# Patient Record
Sex: Female | Born: 2000 | Race: Black or African American | Hispanic: No | Marital: Single | Smoking: Former smoker
Health system: Southern US, Community
[De-identification: ages and names within clinical notes are randomized; demographics above are authoritative.]

## PROBLEM LIST (undated history)

## (undated) ENCOUNTER — Inpatient Hospital Stay (HOSPITAL_COMMUNITY): Payer: Self-pay

## (undated) DIAGNOSIS — Z789 Other specified health status: Secondary | ICD-10-CM

## (undated) DIAGNOSIS — D573 Sickle-cell trait: Secondary | ICD-10-CM

## (undated) HISTORY — PX: OTHER SURGICAL HISTORY: SHX169

## (undated) HISTORY — PX: NO PAST SURGERIES: SHX2092

---
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
## 2019-06-11 ENCOUNTER — Ambulatory Visit (HOSPITAL_COMMUNITY)
Admission: EM | Admit: 2019-06-11 | Discharge: 2019-06-11 | Disposition: A | Discharge status: Home / Self Care | Payer: Self-pay | Attending: Family Medicine | Admitting: Family Medicine

## 2019-06-11 ENCOUNTER — Encounter (HOSPITAL_COMMUNITY): Payer: Self-pay

## 2019-06-11 ENCOUNTER — Other Ambulatory Visit: Payer: Self-pay

## 2019-06-11 DIAGNOSIS — N939 Abnormal uterine and vaginal bleeding, unspecified: Secondary | ICD-10-CM | POA: Insufficient documentation

## 2019-06-11 DIAGNOSIS — Z113 Encounter for screening for infections with a predominantly sexual mode of transmission: Secondary | ICD-10-CM | POA: Insufficient documentation

## 2019-06-11 DIAGNOSIS — Z3202 Encounter for pregnancy test, result negative: Secondary | ICD-10-CM | POA: Insufficient documentation

## 2019-06-11 LAB — POCT URINALYSIS DIP (DEVICE)
Bilirubin Urine: NEGATIVE
Glucose, UA: NEGATIVE mg/dL
Ketones, ur: NEGATIVE mg/dL
Leukocytes,Ua: NEGATIVE
Nitrite: NEGATIVE
Protein, ur: NEGATIVE mg/dL
Specific Gravity, Urine: 1.025 (ref 1.005–1.030)
Urobilinogen, UA: 0.2 mg/dL (ref 0.0–1.0)
pH: 6.5 (ref 5.0–8.0)

## 2019-06-11 LAB — POCT PREGNANCY, URINE: Preg Test, Ur: NEGATIVE

## 2019-06-11 NOTE — ED Triage Notes (Signed)
Pt presents with possible pregnancy and wanting to get STD testing; pt states she has had some unusual discharge and an abnormal period.

## 2019-06-11 NOTE — ED Provider Notes (Signed)
[facility]    CSN: [id number] Arrival date & time: 06/11/19  0857      History   Chief Complaint Chief Complaint  Patient presents with  . Possible Pregnancy  . STD Testing    HPI Alicia Black is a 18 y.o. female.   Alicia Barbankidah Darwin presents with complaints of vaginal spotting for the past two days. Only noted with wiping. LMP 6/28. She is sexually active. No specific known std exposure but she doesn't use condoms. No pelvic or back pain. No urinary symptoms. No fever. No gi symptoms. Bleeding hasn't worsened. States it is pink blood with wiping after urination. Denies any other vaginal discharge or itching. Has had gonorrhea in the past. BV last a few months ago. States her periods are typically regular. Doesn't have a PCP. She does smoke.     ROS per HPI, negative if not otherwise mentioned.      History reviewed. No pertinent past medical history.  There are no active problems to display for this patient.   History reviewed. No pertinent surgical history.  OB History   No obstetric history on file.      Home Medications    Prior to Admission medications   Not on File    Family History Family History  Family history unknown: Yes    Social History Social History   Tobacco Use  . Smoking status: Never Smoker  . Smokeless tobacco: Never Used  Substance Use Topics  . Alcohol use: Never    Frequency: Never  . Drug use: Yes    Types: Marijuana     Allergies   Patient has no known allergies.   Review of Systems Review of Systems   Physical Exam Triage Vital Signs ED Triage Vitals [06/11/19 0923]  Enc Vitals Group     BP 111/76     Pulse Rate 66     Resp 18     Temp 98.5 F (36.9 C)     Temp Source Oral     SpO2 100 %     Weight      Height      Head Circumference      Peak Flow      Pain Score 2     Pain Loc      Pain Edu?      Excl. in GC?    No data found.  Updated Vital Signs BP 111/76 (BP Location: Left Arm)    Pulse 66   Temp 98.5 F (36.9 C) (Oral)   Resp 18   LMP 05/30/2019   SpO2 100%   Visual Acuity Right Eye Distance:   Left Eye Distance:   Bilateral Distance:    Right Eye Near:   Left Eye Near:    Bilateral Near:     Physical Exam Constitutional:      General: She is not in acute distress.    Appearance: She is well-developed.  Cardiovascular:     Rate and Rhythm: Normal rate and regular rhythm.     Heart sounds: Normal heart sounds.  Pulmonary:     Effort: Pulmonary effort is normal.     Breath sounds: Normal breath sounds.  Abdominal:     Palpations: Abdomen is soft. Abdomen is not rigid.     Tenderness: There is no abdominal tenderness. There is no guarding or rebound.  Genitourinary:    Comments: Denies sores, lesions; no pelvic pain; gu exam deferred at this time, vaginal self swab collected.  Skin:    General: Skin is warm and dry.  Neurological:     Mental Status: She is alert and oriented to person, place, and time.      UC Treatments / Results  Labs (all labs ordered are listed, but only abnormal results are displayed) Labs Reviewed  POCT URINALYSIS DIP (DEVICE) - Abnormal; Notable for the following components:      Result Value   Hgb urine dipstick MODERATE (*)    All other components within normal limits  POC URINE PREG, ED  POCT PREGNANCY, URINE  CERVICOVAGINAL ANCILLARY ONLY    EKG   Radiology No results found.  Procedures Procedures (including critical care time)  Medications Ordered in UC Medications - No data to display  Initial Impression / Assessment and Plan / UC Course  I have reviewed the triage vital signs and the nursing notes.  Pertinent labs & imaging results that were available during my care of the patient were reviewed by me and considered in my medical decision making (see chart for details).     Negative urine pregnancy and urine dip without red flags. Vaginal spotting, no fever, no pelvic or back pain. Vaginal  cytology self swab collected and pending. Will notify of any positive findings and if any changes to treatment are needed.  Encouraged self sex practices. Return precautions provided. Patient verbalized understanding and agreeable to plan.   Final Clinical Impressions(s) / UC Diagnoses   Final diagnoses:  Vaginal spotting  Screen for STD (sexually transmitted disease)  Negative pregnancy test     Discharge Instructions     Negative pregnancy here today.  If you miss your period please check a home pregnancy test.  We will test you for vaginal infections as these can cause some vaginal spotting. Will notify you of any positive findings and if any changes to treatment are needed. You may monitor your results on your MyChart online as well.   Please use condoms to prevent std's.  If you are not looking to be pregnant, I would recommend establishing with a primary care provider to initiate birth control.    ED Prescriptions    None     Controlled Substance Prescriptions [place] Controlled Substance Registry consulted? Not Applicable   Zigmund Gottron, NP 06/11/19 [phone]

## 2019-06-11 NOTE — Discharge Instructions (Signed)
Negative pregnancy here today.  If you miss your period please check a home pregnancy test.  We will test you for vaginal infections as these can cause some vaginal spotting. Will notify you of any positive findings and if any changes to treatment are needed. You may monitor your results on your MyChart online as well.   Please use condoms to prevent std's.  If you are not looking to be pregnant, I would recommend establishing with a primary care provider to initiate birth control.

## 2019-06-14 LAB — CERVICOVAGINAL ANCILLARY ONLY
Bacterial vaginitis: POSITIVE — AB
Candida vaginitis: NEGATIVE
Chlamydia: NEGATIVE
Neisseria Gonorrhea: NEGATIVE
Trichomonas: NEGATIVE

## 2019-06-16 ENCOUNTER — Telehealth (HOSPITAL_COMMUNITY): Payer: Self-pay | Admitting: Emergency Medicine

## 2019-06-16 MED ORDER — METRONIDAZOLE 500 MG PO TABS: 500.0000 mg | ORAL_TABLET | Freq: Two times a day (BID) | ORAL | 0 refills | Status: DC

## 2019-06-16 NOTE — Telephone Encounter (Signed)
Bacterial vaginosis is positive. This was not treated at the urgent care visit.  Flagyl 500 mg BID x 7 days #14 no refills sent to patients pharmacy of choice.    Patient contacted and made aware of all results, all questions answered.  

## 2019-10-13 ENCOUNTER — Other Ambulatory Visit: Payer: Self-pay

## 2019-10-13 ENCOUNTER — Inpatient Hospital Stay (HOSPITAL_COMMUNITY): Payer: Medicaid Other

## 2019-10-13 ENCOUNTER — Encounter (HOSPITAL_COMMUNITY): Payer: Self-pay | Admitting: *Deleted

## 2019-10-13 ENCOUNTER — Inpatient Hospital Stay (HOSPITAL_COMMUNITY)
Admission: AD | Admit: 2019-10-13 | Discharge: 2019-10-13 | Disposition: A | Discharge status: Home / Self Care | Payer: Medicaid Other | Attending: Obstetrics & Gynecology | Admitting: Obstetrics & Gynecology

## 2019-10-13 DIAGNOSIS — O209 Hemorrhage in early pregnancy, unspecified: Secondary | ICD-10-CM | POA: Insufficient documentation

## 2019-10-13 DIAGNOSIS — Z3A01 Less than 8 weeks gestation of pregnancy: Secondary | ICD-10-CM | POA: Diagnosis not present

## 2019-10-13 DIAGNOSIS — O3680X0 Pregnancy with inconclusive fetal viability, not applicable or unspecified: Secondary | ICD-10-CM

## 2019-10-13 LAB — URINALYSIS, ROUTINE W REFLEX MICROSCOPIC
Bilirubin Urine: NEGATIVE
Glucose, UA: NEGATIVE mg/dL
Ketones, ur: NEGATIVE mg/dL
Leukocytes,Ua: NEGATIVE
Nitrite: NEGATIVE
Protein, ur: NEGATIVE mg/dL
Specific Gravity, Urine: 1.012 (ref 1.005–1.030)
pH: 6 (ref 5.0–8.0)

## 2019-10-13 LAB — CBC
HCT: 35.5 % — ABNORMAL LOW (ref 36.0–46.0)
Hemoglobin: 12.6 g/dL (ref 12.0–15.0)
MCH: 30.8 pg (ref 26.0–34.0)
MCHC: 35.5 g/dL (ref 30.0–36.0)
MCV: 86.8 fL (ref 80.0–100.0)
Platelets: 262 10*3/uL (ref 150–400)
RBC: 4.09 MIL/uL (ref 3.87–5.11)
RDW: 11.2 % — ABNORMAL LOW (ref 11.5–15.5)
WBC: 11 10*3/uL — ABNORMAL HIGH (ref 4.0–10.5)
nRBC: 0 % (ref 0.0–0.2)

## 2019-10-13 LAB — WET PREP, GENITAL
Sperm: NONE SEEN
Trich, Wet Prep: NONE SEEN
Yeast Wet Prep HPF POC: NONE SEEN

## 2019-10-13 LAB — HIV ANTIBODY (ROUTINE TESTING W REFLEX): HIV Screen 4th Generation wRfx: NONREACTIVE

## 2019-10-13 LAB — ABO/RH: ABO/RH(D): O POS

## 2019-10-13 LAB — POCT PREGNANCY, URINE: Preg Test, Ur: POSITIVE — AB

## 2019-10-13 LAB — HCG, QUANTITATIVE, PREGNANCY: hCG, Beta Chain, Quant, S: 7687 m[IU]/mL — ABNORMAL HIGH (ref <5)

## 2019-10-13 NOTE — MAU Note (Signed)
Pt reports to MAU c/o vaginal bleeding. Pt states she was taking a nap she noticed some red bleeding in her underwear since then it has gotten better. Pt states she is not currently bleeding but she was concerned. Pt denies any recent sexual intercourse. LMP was September 28th 2020. [facility].

## 2019-10-13 NOTE — MAU Provider Note (Signed)
Chief Complaint: Vaginal Bleeding   First Provider Initiated Contact with Patient 10/13/19 2040        SUBJECTIVE HPI: Alicia Black is a 18 y.o. G1P0 at [redacted]w[redacted]d by LMP who presents to maternity admissions reporting vaginal bleeding today.  No pain.  . She denies vaginal itching/burning, urinary symptoms, h/a, dizziness, n/v, or fever/chills.    Vaginal Bleeding The patient's primary symptoms include vaginal bleeding. The patient's pertinent negatives include no genital itching, genital lesions, genital odor or pelvic pain. This is a new problem. The current episode started today. The problem occurs intermittently. The problem has been unchanged. The patient is experiencing no pain. She is pregnant. Pertinent negatives include no back pain, chills, constipation, diarrhea, fever, nausea or vomiting. The vaginal discharge was bloody. The vaginal bleeding is spotting. She has not been passing clots. She has not been passing tissue. Nothing aggravates the symptoms. She has tried nothing for the symptoms. She uses nothing for contraception.   RN Note: Pt reports to MAU c/o vaginal bleeding. Pt states she was taking a nap she noticed some red bleeding in her underwear since then it has gotten better. Pt states she is not currently bleeding but she was concerned. Pt denies any recent sexual intercourse. LMP was September 28th 2020. Pt reports a [facility]UPT at the teen clinic.   No past medical history on file. No past surgical history on file. Social History   Socioeconomic History  . Marital status: Single    Spouse name: Not on file  . Number of children: Not on file  . Years of education: Not on file  . Highest education level: Not on file  Occupational History  . Not on file  Social Needs  . Financial resource strain: Not on file  . Food insecurity    Worry: Not on file    Inability: Not on file  . Transportation needs    Medical: Not on file    Non-medical: Not on file  Tobacco Use  . Smoking  status: Never Smoker  . Smokeless tobacco: Never Used  Substance and Sexual Activity  . Alcohol use: Never    Frequency: Never  . Drug use: Yes    Types: Marijuana  . Sexual activity: Not on file  Lifestyle  . Physical activity    Days per week: Not on file    Minutes per session: Not on file  . Stress: Not on file  Relationships  . Social Herbalist on phone: Not on file    Gets together: Not on file    Attends religious service: Not on file    Active member of club or organization: Not on file    Attends meetings of clubs or organizations: Not on file    Relationship status: Not on file  . Intimate partner violence    Fear of current or ex partner: Not on file    Emotionally abused: Not on file    Physically abused: Not on file    Forced sexual activity: Not on file  Other Topics Concern  . Not on file  Social History Narrative  . Not on file   No current facility-administered medications on file prior to encounter.    Current Outpatient Medications on File Prior to Encounter  Medication Sig Dispense Refill  . Prenatal Vit-Fe Fumarate-FA (PRENATAL MULTIVITAMIN) TABS tablet Take 1 tablet by mouth daily at 12 noon.     No Known Allergies  I have reviewed patient's Past Medical  Hx, Surgical Hx, Family Hx, Social Hx, medications and allergies.   ROS:  Review of Systems  Constitutional: Negative for chills and fever.  Gastrointestinal: Negative for constipation, diarrhea, nausea and vomiting.  Genitourinary: Positive for vaginal bleeding. Negative for pelvic pain.  Musculoskeletal: Negative for back pain.   Review of Systems  Other systems negative   Physical Exam  Physical Exam Patient Vitals for the past 24 hrs:  BP Temp Temp src Pulse Resp Weight  10/13/19 1939 129/74 98.8 F (37.1 C) Oral 69 17 63.4 kg   Constitutional: Well-developed, well-nourished female in no acute distress.  Cardiovascular: normal rate Respiratory: normal effort GI: Abd  soft, non-tender. Pos BS x 4 MS: Extremities nontender, no edema, normal ROM Neurologic: Alert and oriented x 4.  GU: Neg CVAT.  PELVIC EXAM: Cervix pink, visually closed, without lesion, scant red discharge, vaginal walls and external genitalia normal Bimanual exam: Cervix 0/long/high, firm, anterior, neg CMT, uterus nontender, not very enlarged, adnexa without tenderness, enlargement, or mass   LAB RESULTS Results for orders placed or performed during the hospital encounter of 10/13/19 (from the past 24 hour(s))  Urinalysis, Routine w reflex microscopic     Status: Abnormal   Collection Time: 10/13/19  7:10 PM  Result Value Ref Range   Color, Urine YELLOW YELLOW   APPearance CLEAR CLEAR   Specific Gravity, Urine 1.012 1.005 - 1.030   pH 6.0 5.0 - 8.0   Glucose, UA NEGATIVE NEGATIVE mg/dL   Hgb urine dipstick LARGE (A) NEGATIVE   Bilirubin Urine NEGATIVE NEGATIVE   Ketones, ur NEGATIVE NEGATIVE mg/dL   Protein, ur NEGATIVE NEGATIVE mg/dL   Nitrite NEGATIVE NEGATIVE   Leukocytes,Ua NEGATIVE NEGATIVE   RBC / HPF 11-20 0 - 5 RBC/hpf   WBC, UA 0-5 0 - 5 WBC/hpf   Bacteria, UA RARE (A) NONE SEEN   Squamous Epithelial / LPF 0-5 0 - 5  Pregnancy, urine POC     Status: Abnormal   Collection Time: 10/13/19  7:18 PM  Result Value Ref Range   Preg Test, Ur POSITIVE (A) NEGATIVE  CBC     Status: Abnormal   Collection Time: 10/13/19  8:25 PM  Result Value Ref Range   WBC 11.0 (H) 4.0 - 10.5 K/uL   RBC 4.09 3.87 - 5.11 MIL/uL   Hemoglobin 12.6 12.0 - 15.0 g/dL   HCT 44.0 (L) 34.7 - 42.5 %   MCV 86.8 80.0 - 100.0 fL   MCH 30.8 26.0 - 34.0 pg   MCHC 35.5 30.0 - 36.0 g/dL   RDW 95.6 (L) 38.7 - 56.4 %   Platelets 262 150 - 400 K/uL   nRBC 0.0 0.0 - 0.2 %  hCG quantitative     Status: Abnormal   Collection Time: 10/13/19  8:25 PM  Result Value Ref Range   hCG, Beta Chain, Quant, S 7,687 (H) <5 mIU/mL  HIV Antibody (routine testing w rflx)     Status: None   Collection Time: 10/13/19   8:25 PM  Result Value Ref Range   HIV Screen 4th Generation wRfx NON REACTIVE NON REACTIVE  ABO/Rh     Status: None   Collection Time: 10/13/19  8:25 PM  Result Value Ref Range   ABO/RH(D) O POS    No rh immune globuloin      NOT A RH IMMUNE GLOBULIN CANDIDATE, PT RH POSITIVE Performed at [facility], [address], [place], [place] [address]   Wet prep, genital  Status: Abnormal   Collection Time: 10/13/19  8:48 PM  Result Value Ref Range   Yeast Wet Prep HPF POC NONE SEEN NONE SEEN   Trich, Wet Prep NONE SEEN NONE SEEN   Clue Cells Wet Prep HPF POC PRESENT (A) NONE SEEN   WBC, Wet Prep HPF POC MODERATE (A) NONE SEEN   Sperm NONE SEEN     IMAGING [place] Ob Less Than 14 Weeks With Ob Transvaginal  Result Date: 10/13/2019 CLINICAL DATA:  Vaginal bleeding. EXAM: OBSTETRIC <14 WK US AND TRANSVAGINAL OB US TECHNIQUE: Both transabdominal and transvaginal ultrasound examinations were performed for complete evaluation of the gestation as well as the maternal uterus, adnexal regions, and pelvic cul-de-sac. Transvaginal technique was performed to assess early pregnancy. COMPARISON:  None. FINDINGS: Intrauterine gestational sac: None Yolk sac:  Not Visualized. Embryo:  Not Visualized. Cardiac Activity: Not Visualized. Subchorionic hemorrhage:  None visualized. Maternal uterus/adnexae: There is no maternal abnormality. The right ovary measures 3.5 x 3.5 x 3.1 cm. There is a small corpus luteal cyst. The left ovary measures 3.3 x 1.9 x 1.8 cm. There is a trace amount of free fluid in the patient's pelvis. IMPRESSION: No IUP is visualized. By definition, in the setting of a positive pregnancy test, this reflects a pregnancy of unknown location. Differential considerations include early normal IUP, abnormal IUP/missed abortion, or nonvisualized ectopic pregnancy. Serial beta HCG is suggested. Consider repeat pelvic ultrasound in 14 days. Electronically Signed   By: Katherine Mantlehristopher  Green M.D.   On:  10/13/2019 21:18     MAU Management/MDM: Ordered usual first trimester r/o ectopic labs.   Pelvic exam and cultures done Will check baseline Ultrasound to rule out ectopic.  This bleeding/pain can represent a normal pregnancy with bleeding, spontaneous abortion or even an ectopic which can be life-threatening.  The process as listed above helps to determine which of these is present.  Reviewed results Unable to see pregnancy on US, so with bleeding we cannot rule out ectopic, SAB or normal early pregnancy  Recommend repeat HCG in 48 hrs then US if doubles  ASSESSMENT Pregnancy at 4071w3d Pregnancy of unknown location Bleeding in first trimester  PLAN Discharge home Plan to repeat HCG level in 48 hours in clinic  Will repeat  Ultrasound in about 7-10 days if HCG levels double appropriately  Ectopic precautions   Pt stable at time of discharge. Encouraged to return here or to other Urgent Care/ED if she develops worsening of symptoms, increase in pain, fever, or other concerning symptoms.    Wynelle BourgeoisMarie Williams CNM, MSN Certified Nurse-Midwife 10/13/2019  8:40 PM

## 2019-10-13 NOTE — Discharge Instructions (Signed)
Vaginal Bleeding During Pregnancy, First Trimester ° °A small amount of bleeding from the vagina (spotting) is relatively common during early pregnancy. It usually stops on its own. Various things may cause bleeding or spotting during early pregnancy. Some bleeding may be related to the pregnancy, and some may not. In many cases, the bleeding is normal and is not a problem. However, bleeding can also be a sign of something serious. Be sure to tell your health care provider about any vaginal bleeding right away. °Some possible causes of vaginal bleeding during the first trimester include: °· Infection or inflammation of the cervix. °· Growths (polyps) on the cervix. °· Miscarriage or threatened miscarriage. °· Pregnancy tissue developing outside of the uterus (ectopic pregnancy). °· A mass of tissue developing in the uterus due to an egg being fertilized incorrectly (molar pregnancy). °Follow these instructions at home: °Activity °· Follow instructions from your health care provider about limiting your activity. Ask what activities are safe for you. °· If needed, make plans for someone to help with your regular activities. °· Do not have sex or orgasms until your health care provider says that this is safe. °General instructions °· Take over-the-counter and prescription medicines only as told by your health care provider. °· Pay attention to any changes in your symptoms. °· Do not use tampons or douche. °· Write down how many pads you use each day, how often you change pads, and how soaked (saturated) they are. °· If you pass any tissue from your vagina, save the tissue so you can show it to your health care provider. °· Keep all follow-up visits as told by your health care provider. This is important. °Contact a health care provider if: °· You have vaginal bleeding during any part of your pregnancy. °· You have cramps or labor pains. °· You have a fever. °Get help right away if: °· You have severe cramps in your  back or abdomen. °· You pass large clots or a large amount of tissue from your vagina. °· Your bleeding increases. °· You feel light-headed or weak, or you faint. °· You have chills. °· You are leaking fluid or have a gush of fluid from your vagina. °Summary °· A small amount of bleeding (spotting) from the vagina is relatively common during early pregnancy. °· Various things may cause bleeding or spotting in early pregnancy. °· Be sure to tell your health care provider about any vaginal bleeding right away. °This information is not intended to replace advice given to you by your health care provider. Make sure you discuss any questions you have with your health care provider. °Document Released: 08/28/2005 Document Revised: 03/09/2019 Document Reviewed: 02/20/2017 °Elsevier Patient Education © 2020 [employer] ° °

## 2019-10-15 LAB — GC/CHLAMYDIA PROBE AMP (~~LOC~~) NOT AT ARMC
Chlamydia: NEGATIVE
Comment: NEGATIVE
Comment: NORMAL
Neisseria Gonorrhea: NEGATIVE

## 2019-10-16 ENCOUNTER — Other Ambulatory Visit: Payer: Self-pay

## 2019-10-16 ENCOUNTER — Inpatient Hospital Stay (HOSPITAL_COMMUNITY)
Admission: AD | Admit: 2019-10-16 | Discharge: 2019-10-16 | Disposition: A | Discharge status: Home / Self Care | Payer: Medicaid Other | Attending: Obstetrics and Gynecology | Admitting: Obstetrics and Gynecology

## 2019-10-16 DIAGNOSIS — O039 Complete or unspecified spontaneous abortion without complication: Secondary | ICD-10-CM | POA: Insufficient documentation

## 2019-10-16 LAB — HCG, QUANTITATIVE, PREGNANCY: hCG, Beta Chain, Quant, S: 1350 m[IU]/mL — ABNORMAL HIGH (ref <5)

## 2019-10-16 NOTE — MAU Provider Note (Signed)
Chief Complaint: Follow-up   None     SUBJECTIVE HPI: Alicia Black is a 18 y.o. G1P0 at [redacted]w[redacted]d by LMP who presents to maternity admissions for repeat hcg today. She initially presented on 10/13/19 with light vaginal bleeding in pregnancy.  Hcg on 10/13/19 was 7, 687 and [place] did not confirm IUP or ectopic.  She denies any pain or bleeding today.       HPI  Past Medical History:  Diagnosis Date  . Medical history non-contributory    Past Surgical History:  Procedure Laterality Date  . NO PAST SURGERIES     Social History   Socioeconomic History  . Marital status: Single    Spouse name: Not on file  . Number of children: Not on file  . Years of education: Not on file  . Highest education level: Not on file  Occupational History  . Not on file  Social Needs  . Financial resource strain: Not on file  . Food insecurity    Worry: Not on file    Inability: Not on file  . Transportation needs    Medical: Not on file    Non-medical: Not on file  Tobacco Use  . Smoking status: Never Smoker  . Smokeless tobacco: Never Used  Substance and Sexual Activity  . Alcohol use: Never    Frequency: Never  . Drug use: Not Currently    Types: Marijuana  . Sexual activity: Yes  Lifestyle  . Physical activity    Days per week: Not on file    Minutes per session: Not on file  . Stress: Not on file  Relationships  . Social Musician on phone: Not on file    Gets together: Not on file    Attends religious service: Not on file    Active member of club or organization: Not on file    Attends meetings of clubs or organizations: Not on file    Relationship status: Not on file  . Intimate partner violence    Fear of current or ex partner: Not on file    Emotionally abused: Not on file    Physically abused: Not on file    Forced sexual activity: Not on file  Other Topics Concern  . Not on file  Social History Narrative  . Not on file   No current facility-administered  medications on file prior to encounter.    Current Outpatient Medications on File Prior to Encounter  Medication Sig Dispense Refill  . Prenatal Vit-Fe Fumarate-FA (PRENATAL MULTIVITAMIN) TABS tablet Take 1 tablet by mouth daily at 12 noon.     No Known Allergies  ROS:  Review of Systems  Constitutional: Negative for chills, fatigue and fever.  Eyes: Negative for visual disturbance.  Respiratory: Negative for shortness of breath.   Cardiovascular: Negative for chest pain.  Gastrointestinal: Negative for abdominal pain, nausea and vomiting.  Genitourinary: Negative for difficulty urinating, dysuria, flank pain, pelvic pain, vaginal bleeding, vaginal discharge and vaginal pain.  Neurological: Negative for dizziness and headaches.  Psychiatric/Behavioral: Negative.      I have reviewed patient's Past Medical Hx, Surgical Hx, Family Hx, Social Hx, medications and allergies.   Physical Exam   Patient Vitals for the past 24 hrs:  BP Temp Temp src Pulse Resp SpO2 Height Weight  10/16/19 1106 127/68 98.5 F (36.9 C) Oral 87 16 100 % - -  10/16/19 1105 - - - - - - 5\' 8"  (1.727 m) 62.5 kg  Constitutional: Well-developed, well-nourished female in no acute distress.  Cardiovascular: normal rate Respiratory: normal effort GI: Abd soft, non-tender. Pos BS x 4 MS: Extremities nontender, no edema, normal ROM Neurologic: Alert and oriented x 4.  GU: Neg CVAT.   LAB RESULTS Results for orders placed or performed during the hospital encounter of 10/16/19 (from the past 24 hour(s))  hCG quantitative     Status: Abnormal   Collection Time: 10/16/19 11:05 AM  Result Value Ref Range   hCG, Beta Chain, Quant, S 1,350 (H) <5 mIU/mL    --/--/O POS (11/11 2025)  IMAGING US Ob Less Than 14 Weeks With Ob Transvaginal  Result Date: 10/13/2019 CLINICAL DATA:  Vaginal bleeding. EXAM: OBSTETRIC <14 WK [place] AND TRANSVAGINAL OB US TECHNIQUE: Both transabdominal and transvaginal ultrasound  examinations were performed for complete evaluation of the gestation as well as the maternal uterus, adnexal regions, and pelvic cul-de-sac. Transvaginal technique was performed to assess early pregnancy. COMPARISON:  None. FINDINGS: Intrauterine gestational sac: None Yolk sac:  Not Visualized. Embryo:  Not Visualized. Cardiac Activity: Not Visualized. Subchorionic hemorrhage:  None visualized. Maternal uterus/adnexae: There is no maternal abnormality. The right ovary measures 3.5 x 3.5 x 3.1 cm. There is a small corpus luteal cyst. The left ovary measures 3.3 x 1.9 x 1.8 cm. There is a trace amount of free fluid in the patient's pelvis. IMPRESSION: No IUP is visualized. By definition, in the setting of a positive pregnancy test, this reflects a pregnancy of unknown location. Differential considerations include early normal IUP, abnormal IUP/missed abortion, or nonvisualized ectopic pregnancy. Serial beta HCG is suggested. Consider repeat pelvic ultrasound in 14 days. Electronically Signed   By: Constance Holster M.D.   On: 10/13/2019 21:18    MAU Management/MDM: Orders Placed This Encounter  Procedures  . hCG quantitative  . Discharge patient    No orders of the defined types were placed in this encounter.   Quant hcg today with significant drop, indicating SAB.  Discussed results with pt.  Pt states understanding.  Plan to f/u with hcg in 1 week visit with provider in 2 weeks at [facility], message sent to establish care.  Bleeding/miscarriage precautions/reasons to return to MAU reviewed.    ASSESSMENT 1. SAB (spontaneous abortion)     PLAN Discharge home Allergies as of 10/16/2019   No Known Allergies     Medication List    TAKE these medications   prenatal multivitamin Tabs tablet Take 1 tablet by mouth daily at 12 noon.        Alicia Black Certified Nurse-Midwife 10/16/2019  12:44 PM

## 2019-10-16 NOTE — MAU Note (Signed)
Alicia Black is a 18 y.o. at [redacted]w[redacted]d here in MAU reporting: here for follow up hcg. Denies pain or bleeding.   Pain score: 0/10  Vitals:   10/16/19 1106  BP: 127/68  Pulse: 87  Resp: 16  Temp: 98.5 F (36.9 C)  SpO2: 100%     Lab orders placed from triage: hcg

## 2019-10-21 ENCOUNTER — Other Ambulatory Visit: Payer: Self-pay | Admitting: *Deleted

## 2019-10-21 DIAGNOSIS — O039 Complete or unspecified spontaneous abortion without complication: Secondary | ICD-10-CM

## 2019-10-25 ENCOUNTER — Other Ambulatory Visit: Payer: Self-pay

## 2019-11-03 ENCOUNTER — Encounter: Payer: Self-pay | Admitting: Advanced Practice Midwife

## 2019-12-03 NOTE — L&D Delivery Note (Addendum)
..  OB/GYN Faculty Practice Delivery Note  Alicia Black is a 19 y.o. G2P0010 s/p vaginal delivery at [redacted]w[redacted]d. She was admitted for PPROM   ROM: 11:30 8.5.21 with  fluid GBS Status: NEGATIVE  Maximum Maternal Temperature: 98.8  Labor Progress: [name] admitted on 8.5.21 after leaking fluid in a puddle on the floor.  Upon admission she was contracting every 5 minutes and her initial cervical exam was 1/70/-1.  She received Betamethasone at 1400 as well as GBS prophylaxis for unknown GBS status.  She began labor augmentation at 1650 with pitocin.  A foley bulb was inserted and came out on its own shortly after.  She continued to progress and received an epidural and amniofusion for some variable decelerations.  She was complete at 00:04  Delivery Date/Time: 8.6.21 at 00:25  Delivery: Called to room and patient was complete and pushing. Head delivered LOA. No nuchal cord present. Shoulder and body delivered in usual fashion. Infant with spontaneous cry, placed on mother's abdomen, dried and stimulated. Cord clamped x 2 after 1-minute delay, and cut by friend of Mother. Cord blood drawn. Placenta delivered spontaneously with gentle cord traction. Fundus firm with massage and Pitocin. Labia, perineum, vagina, and cervix inspected inspected. Bilateral labial lacerations repaired with one interrupted stitch each.   Placenta: 3 V cord, intact Complications: IUGR  Lacerations: 2 bilateral upper labial lacerations EBL: 150 mL Analgesia: epidural   Postpartum Planning [ x] message to sent to schedule follow-up  [ x] vaccines UTD  Infant: APGARs 8 & 9   Weight not assessed yet   Bryson Dames, SNM  07/07/2020, 1:03 AM  The above was performed under my direct supervision and guidance.

## 2019-12-08 ENCOUNTER — Other Ambulatory Visit: Payer: Self-pay

## 2019-12-08 ENCOUNTER — Encounter (HOSPITAL_COMMUNITY): Payer: Self-pay

## 2019-12-08 DIAGNOSIS — O4691 Antepartum hemorrhage, unspecified, first trimester: Secondary | ICD-10-CM | POA: Insufficient documentation

## 2019-12-08 DIAGNOSIS — Z3A01 Less than 8 weeks gestation of pregnancy: Secondary | ICD-10-CM | POA: Insufficient documentation

## 2019-12-08 DIAGNOSIS — B9689 Other specified bacterial agents as the cause of diseases classified elsewhere: Secondary | ICD-10-CM | POA: Insufficient documentation

## 2019-12-08 DIAGNOSIS — Z79899 Other long term (current) drug therapy: Secondary | ICD-10-CM | POA: Diagnosis not present

## 2019-12-08 DIAGNOSIS — N760 Acute vaginitis: Secondary | ICD-10-CM | POA: Insufficient documentation

## 2019-12-08 DIAGNOSIS — O99891 Other specified diseases and conditions complicating pregnancy: Secondary | ICD-10-CM | POA: Diagnosis not present

## 2019-12-08 NOTE — ED Notes (Signed)
Pt ambulatory to the bathroom in triage and is attempting to give urine specimen in triage.

## 2019-12-08 NOTE — ED Triage Notes (Signed)
Pt coming in c/o vaginal bleeding that started 30 minutes with clots. Pt is [redacted] weeks pregnant. Last miscarriage was Nov 14th (7 weeks at the time). No N/V/D

## 2019-12-09 ENCOUNTER — Emergency Department (HOSPITAL_COMMUNITY): Payer: Medicaid Other

## 2019-12-09 ENCOUNTER — Emergency Department (HOSPITAL_COMMUNITY)
Admission: EM | Admit: 2019-12-09 | Discharge: 2019-12-09 | Disposition: A | Discharge status: Home / Self Care | Payer: Medicaid Other | Attending: Emergency Medicine | Admitting: Emergency Medicine

## 2019-12-09 DIAGNOSIS — O4690 Antepartum hemorrhage, unspecified, unspecified trimester: Secondary | ICD-10-CM

## 2019-12-09 DIAGNOSIS — N939 Abnormal uterine and vaginal bleeding, unspecified: Secondary | ICD-10-CM

## 2019-12-09 LAB — URINALYSIS, ROUTINE W REFLEX MICROSCOPIC
Bilirubin Urine: NEGATIVE
Glucose, UA: NEGATIVE mg/dL
Ketones, ur: 20 mg/dL — AB
Leukocytes,Ua: NEGATIVE
Nitrite: NEGATIVE
Protein, ur: NEGATIVE mg/dL
Specific Gravity, Urine: 1.017 (ref 1.005–1.030)
pH: 6 (ref 5.0–8.0)

## 2019-12-09 LAB — CBC WITH DIFFERENTIAL/PLATELET
Abs Immature Granulocytes: 0.03 10*3/uL (ref 0.00–0.07)
Basophils Absolute: 0 10*3/uL (ref 0.0–0.1)
Basophils Relative: 0 %
Eosinophils Absolute: 0.1 10*3/uL (ref 0.0–0.5)
Eosinophils Relative: 1 %
HCT: 34.2 % — ABNORMAL LOW (ref 36.0–46.0)
Hemoglobin: 11.6 g/dL — ABNORMAL LOW (ref 12.0–15.0)
Immature Granulocytes: 0 %
Lymphocytes Relative: 24 %
Lymphs Abs: 2.5 10*3/uL (ref 0.7–4.0)
MCH: 30.4 pg (ref 26.0–34.0)
MCHC: 33.9 g/dL (ref 30.0–36.0)
MCV: 89.5 fL (ref 80.0–100.0)
Monocytes Absolute: 0.6 10*3/uL (ref 0.1–1.0)
Monocytes Relative: 6 %
Neutro Abs: 7.3 10*3/uL (ref 1.7–7.7)
Neutrophils Relative %: 69 %
Platelets: 283 10*3/uL (ref 150–400)
RBC: 3.82 MIL/uL — ABNORMAL LOW (ref 3.87–5.11)
RDW: 11.5 % (ref 11.5–15.5)
WBC: 10.6 10*3/uL — ABNORMAL HIGH (ref 4.0–10.5)
nRBC: 0 % (ref 0.0–0.2)

## 2019-12-09 LAB — BASIC METABOLIC PANEL
Anion gap: 9 (ref 5–15)
BUN: 6 mg/dL (ref 6–20)
CO2: 21 mmol/L — ABNORMAL LOW (ref 22–32)
Calcium: 9.1 mg/dL (ref 8.9–10.3)
Chloride: 105 mmol/L (ref 98–111)
Creatinine, Ser: 0.53 mg/dL (ref 0.44–1.00)
GFR calc Af Amer: >60 mL/min (ref >60)
GFR calc non Af Amer: >60 mL/min (ref >60)
Glucose, Bld: 82 mg/dL (ref 70–99)
Potassium: 3.4 mmol/L — ABNORMAL LOW (ref 3.5–5.1)
Sodium: 135 mmol/L (ref 135–145)

## 2019-12-09 LAB — PREGNANCY, URINE: Preg Test, Ur: POSITIVE — AB

## 2019-12-09 LAB — WET PREP, GENITAL
Sperm: NONE SEEN
Trich, Wet Prep: NONE SEEN
Yeast Wet Prep HPF POC: NONE SEEN

## 2019-12-09 LAB — HCG, QUANTITATIVE, PREGNANCY: hCG, Beta Chain, Quant, S: 14988 m[IU]/mL — ABNORMAL HIGH (ref <5)

## 2019-12-09 NOTE — ED Notes (Signed)
US at bedside

## 2019-12-09 NOTE — ED Provider Notes (Signed)
[facility]-EMERGENCY DEPT Provider Note   CSN: [id number] Arrival date & time: 12/08/19  2207     History Chief Complaint  Patient presents with  . Vaginal Bleeding    6 weeks preg    Alicia Black is a 19 y.o. female.  Patient reporting being [redacted] weeks pregnant presents with vaginal bleeding this evening. She reports very mild discomfort in lower abdomen. She passed a large clot and reports the bleeding progressively slowed, stopping after arrival in the ED. She reports history of recent miscarriage in November of 2020. She has not had an ultrasound or prenatal care yet. No nausea, vomiting or urinary symptoms.   The history is provided by the patient. No language interpreter was used.  Vaginal Bleeding Associated symptoms: no abdominal pain, no back pain, no dysuria, no fatigue and no vaginal discharge        Past Medical History:  Diagnosis Date  . Medical history non-contributory     There are no problems to display for this patient.   Past Surgical History:  Procedure Laterality Date  . NO PAST SURGERIES       OB History    Gravida  1   Para      Term      Preterm      AB      Living        SAB      TAB      Ectopic      Multiple      Live Births              Family History  Family history unknown: Yes    Social History   Tobacco Use  . Smoking status: Never Smoker  . Smokeless tobacco: Never Used  Substance Use Topics  . Alcohol use: Never  . Drug use: Not Currently    Types: Marijuana    Home Medications Prior to Admission medications   Medication Sig Start Date End Date Taking? Authorizing Provider  Prenatal Vit-Fe Fumarate-FA (PRENATAL MULTIVITAMIN) TABS tablet Take 1 tablet by mouth daily at 12 noon.    [provider]    Allergies    Patient has no known allergies.  Review of Systems   Review of Systems  Constitutional: Negative for fatigue.  Respiratory: Negative for shortness of  breath.   Gastrointestinal: Negative for abdominal pain and vomiting.  Genitourinary: Positive for vaginal bleeding. Negative for dysuria and vaginal discharge.  Musculoskeletal: Negative for back pain.  Neurological: Negative for light-headedness.    Physical Exam Updated Vital Signs BP 114/61 (BP Location: Left Arm)   Pulse 72   Temp 99.1 F (37.3 C) (Oral)   Resp 13   Ht 5\' 7"  (1.702 m)   Wt 63.5 kg   LMP 08/30/2019 (Exact Date)   SpO2 100%   BMI 21.93 kg/m   Physical Exam Vitals and nursing note reviewed.  Constitutional:      Appearance: She is well-developed.  HENT:     Head: Normocephalic.  Cardiovascular:     Rate and Rhythm: Normal rate and regular rhythm.  Pulmonary:     Effort: Pulmonary effort is normal.     Breath sounds: Normal breath sounds. No wheezing, rhonchi or rales.  Abdominal:     General: Bowel sounds are normal.     Palpations: Abdomen is soft.     Tenderness: There is no abdominal tenderness. There is no guarding or rebound.  Genitourinary:    Comments:  Cervix in nonfriable. No active bleeding. No discharge. There is no adnexal tenderness or CMT on palpation.  Musculoskeletal:        General: Normal range of motion.     Cervical back: Normal range of motion and neck supple.  Skin:    General: Skin is warm and dry.     Findings: No rash.  Neurological:     Mental Status: She is alert and oriented to person, place, and time.     ED Results / Procedures / Treatments   Labs (all labs ordered are listed, but only abnormal results are displayed) Labs Reviewed  WET PREP, GENITAL  CBC WITH DIFFERENTIAL/PLATELET  BASIC METABOLIC PANEL  URINALYSIS, ROUTINE W REFLEX MICROSCOPIC  HCG, QUANTITATIVE, PREGNANCY  I-STAT BETA HCG BLOOD, ED ([facility], [facility], [facility] ONLY)  GC/CHLAMYDIA PROBE AMP ([place]) NOT AT [facility]   Results for orders placed or performed during the hospital encounter of 12/09/19  Wet prep, genital   Specimen: Cervix  Result Value Ref  Range   Yeast Wet Prep HPF POC NONE SEEN NONE SEEN   Trich, Wet Prep NONE SEEN NONE SEEN   Clue Cells Wet Prep HPF POC PRESENT (A) NONE SEEN   WBC, Wet Prep HPF POC FEW (A) NONE SEEN   Sperm NONE SEEN   CBC with Differential  Result Value Ref Range   WBC 10.6 (H) 4.0 - 10.5 K/uL   RBC 3.82 (L) 3.87 - 5.11 MIL/uL   Hemoglobin 11.6 (L) 12.0 - 15.0 g/dL   HCT 62.6 (L) 94.8 - 54.6 %   MCV 89.5 80.0 - 100.0 fL   MCH 30.4 26.0 - 34.0 pg   MCHC 33.9 30.0 - 36.0 g/dL   RDW 27.0 35.0 - 09.3 %   Platelets 283 150 - 400 K/uL   nRBC 0.0 0.0 - 0.2 %   Neutrophils Relative % 69 %   Neutro Abs 7.3 1.7 - 7.7 K/uL   Lymphocytes Relative 24 %   Lymphs Abs 2.5 0.7 - 4.0 K/uL   Monocytes Relative 6 %   Monocytes Absolute 0.6 0.1 - 1.0 K/uL   Eosinophils Relative 1 %   Eosinophils Absolute 0.1 0.0 - 0.5 K/uL   Basophils Relative 0 %   Basophils Absolute 0.0 0.0 - 0.1 K/uL   Immature Granulocytes 0 %   Abs Immature Granulocytes 0.03 0.00 - 0.07 K/uL  Basic metabolic panel  Result Value Ref Range   Sodium 135 135 - 145 mmol/L   Potassium 3.4 (L) 3.5 - 5.1 mmol/L   Chloride 105 98 - 111 mmol/L   CO2 21 (L) 22 - 32 mmol/L   Glucose, Bld 82 70 - 99 mg/dL   BUN 6 6 - 20 mg/dL   Creatinine, Ser 8.18 0.44 - 1.00 mg/dL   Calcium 9.1 8.9 - 29.9 mg/dL   GFR calc non Af Amer >60 >60 mL/min   GFR calc Af Amer >60 >60 mL/min   Anion gap 9 5 - 15  Urinalysis, Routine w reflex microscopic  Result Value Ref Range   Color, Urine YELLOW YELLOW   APPearance HAZY (A) CLEAR   Specific Gravity, Urine 1.017 1.005 - 1.030   pH 6.0 5.0 - 8.0   Glucose, UA NEGATIVE NEGATIVE mg/dL   Hgb urine dipstick LARGE (A) NEGATIVE   Bilirubin Urine NEGATIVE NEGATIVE   Ketones, ur 20 (A) NEGATIVE mg/dL   Protein, ur NEGATIVE NEGATIVE mg/dL   Nitrite NEGATIVE NEGATIVE   Leukocytes,Ua NEGATIVE NEGATIVE  RBC / HPF 0-5 0 - 5 RBC/hpf   WBC, UA 6-10 0 - 5 WBC/hpf   Bacteria, UA RARE (A) NONE SEEN   Squamous Epithelial /  LPF 0-5 0 - 5   Mucus PRESENT   hCG, quantitative, pregnancy  Result Value Ref Range   hCG, Beta Chain, Quant, S 14,988 (H) <5 mIU/mL  Pregnancy, urine  Result Value Ref Range   Preg Test, Ur POSITIVE (A) NEGATIVE    EKG None  Radiology No results found. US OB LESS THAN 14 WEEKS WITH OB TRANSVAGINAL  Result Date: 12/09/2019 CLINICAL DATA:  Vaginal bleeding, prior miscarriage 10/16/2019 EXAM: OBSTETRIC <14 WK [place] AND TRANSVAGINAL OB US TECHNIQUE: Both transabdominal and transvaginal ultrasound examinations were performed for complete evaluation of the gestation as well as the maternal uterus, adnexal regions, and pelvic cul-de-sac. Transvaginal technique was performed to assess early pregnancy. COMPARISON:  Prior gestational ultrasound 10/13/2019 FINDINGS: Intrauterine gestational sac: Single Yolk sac:  Visualized. Embryo:  Visualized. Cardiac Activity: Visualized. Heart Rate: 111 bpm CRL:  3.3 mm   6 w   0 d                  [place] EDC: 08/03/2020 Subchorionic hemorrhage:  None visualized. Maternal uterus/adnexae: Normal appearance of the right ovary with a peripherally vascular corpus luteum cyst. The left ovary is not well visualized. No gross abnormality seen in the left adnexa. No free fluid in the pelvis. IMPRESSION: Single intrauterine gestation at 6 weeks 0 days by crown-rump length measurement. Borderline low fetal heart rate (111 beats per minute, normal > 110 bpm). Electronically Signed   By: Lovena Le M.D.   On: 12/09/2019 05:00    Procedures Procedures (including critical care time)  Medications Ordered in ED Medications - No data to display  ED Course  I have reviewed the triage vital signs and the nursing notes.  Pertinent labs & imaging results that were available during my care of the patient were reviewed by me and considered in my medical decision making (see chart for details).    MDM Rules/Calculators/A&P                      Patient to ED with vaginal bleeding  while pregnant. History of miscarriage. No other symptoms.   She is well appearing, VSS, hemodynamically stable. There is no evidence to suggest pelvic infection on exam. Wet prep shows BV, persistent/recurrent since November 2020.   [place] confirms IUP, 101w0d, HR 111.   No further bleeding while in the ED tonight. She reports having a scheduled appointment later today with her OB. She is strongly encouraged to keep this appointment. It is discussed that she needs to discuss treatment of BV in early pregnancy with her OB at that visit. No treatment provided here for that reason. Patient acknowledges understanding.   Final Clinical Impression(s) / ED Diagnoses Final diagnoses:  None   1. Bleeding in early pregnancy 2. BV  Rx / DC Orders ED Discharge Orders    None       Charlann Lange, Hershal Coria 12/09/19 0721    Shanon Rosser, MD 12/09/19 2235

## 2019-12-09 NOTE — ED Notes (Signed)
Pt upset that she has not been seen yet, and is asking when she will be seen. Pt asked, "what do I need to do, fall out and call an ambulance?". Pt encouraged to continue waiting in the lobby for a room, and staff would continue to update her as best as we could. Pt upset at this time but understands what she is waiting for.

## 2019-12-09 NOTE — Discharge Instructions (Addendum)
Keep your scheduled appointment later today with your OB/GYN. It is important that you discuss your continued BV infection with your OB so that it will be treated per their recommendation.

## 2019-12-09 NOTE — ED Notes (Signed)
Pt verbalized discharge instructions and follow up care. Alert and ambulatory. No IV.  

## 2019-12-10 LAB — GC/CHLAMYDIA PROBE AMP (~~LOC~~) NOT AT ARMC
Chlamydia: NEGATIVE
Neisseria Gonorrhea: NEGATIVE

## 2019-12-14 ENCOUNTER — Other Ambulatory Visit: Payer: Self-pay

## 2019-12-14 ENCOUNTER — Encounter (HOSPITAL_COMMUNITY): Payer: Self-pay

## 2019-12-14 ENCOUNTER — Emergency Department (HOSPITAL_COMMUNITY)
Admission: EM | Admit: 2019-12-14 | Discharge: 2019-12-15 | Disposition: A | Discharge status: Home / Self Care | Payer: Medicaid Other | Attending: Emergency Medicine | Admitting: Emergency Medicine

## 2019-12-14 ENCOUNTER — Inpatient Hospital Stay (EMERGENCY_DEPARTMENT_HOSPITAL)
Admission: AD | Admit: 2019-12-14 | Discharge: 2019-12-14 | Disposition: A | Discharge status: Home / Self Care | Payer: Medicaid Other | Source: Home / Self Care | Attending: Family Medicine | Admitting: Family Medicine

## 2019-12-14 ENCOUNTER — Encounter (HOSPITAL_COMMUNITY): Payer: Self-pay | Admitting: Family Medicine

## 2019-12-14 ENCOUNTER — Inpatient Hospital Stay (HOSPITAL_COMMUNITY): Payer: Medicaid Other

## 2019-12-14 DIAGNOSIS — N939 Abnormal uterine and vaginal bleeding, unspecified: Secondary | ICD-10-CM | POA: Diagnosis not present

## 2019-12-14 DIAGNOSIS — O360910 Maternal care for other rhesus isoimmunization, first trimester, not applicable or unspecified: Secondary | ICD-10-CM | POA: Diagnosis not present

## 2019-12-14 DIAGNOSIS — Z3A01 Less than 8 weeks gestation of pregnancy: Secondary | ICD-10-CM

## 2019-12-14 DIAGNOSIS — O26891 Other specified pregnancy related conditions, first trimester: Secondary | ICD-10-CM | POA: Insufficient documentation

## 2019-12-14 DIAGNOSIS — Z5321 Procedure and treatment not carried out due to patient leaving prior to being seen by health care provider: Secondary | ICD-10-CM | POA: Insufficient documentation

## 2019-12-14 DIAGNOSIS — Z6790 Unspecified blood type, Rh positive: Secondary | ICD-10-CM | POA: Diagnosis not present

## 2019-12-14 DIAGNOSIS — Z3491 Encounter for supervision of normal pregnancy, unspecified, first trimester: Secondary | ICD-10-CM

## 2019-12-14 DIAGNOSIS — O209 Hemorrhage in early pregnancy, unspecified: Secondary | ICD-10-CM | POA: Insufficient documentation

## 2019-12-14 LAB — CBC
HCT: 32.6 % — ABNORMAL LOW (ref 36.0–46.0)
Hemoglobin: 11.3 g/dL — ABNORMAL LOW (ref 12.0–15.0)
MCH: 30.4 pg (ref 26.0–34.0)
MCHC: 34.7 g/dL (ref 30.0–36.0)
MCV: 87.6 fL (ref 80.0–100.0)
Platelets: 252 10*3/uL (ref 150–400)
RBC: 3.72 MIL/uL — ABNORMAL LOW (ref 3.87–5.11)
RDW: 11.2 % — ABNORMAL LOW (ref 11.5–15.5)
WBC: 8 10*3/uL (ref 4.0–10.5)
nRBC: 0 % (ref 0.0–0.2)

## 2019-12-14 LAB — URINALYSIS, ROUTINE W REFLEX MICROSCOPIC
Bilirubin Urine: NEGATIVE
Glucose, UA: NEGATIVE mg/dL
Ketones, ur: NEGATIVE mg/dL
Leukocytes,Ua: NEGATIVE
Nitrite: NEGATIVE
Protein, ur: 100 mg/dL — AB
RBC / HPF: >50 RBC/hpf — ABNORMAL HIGH (ref 0–5)
Specific Gravity, Urine: 1.008 (ref 1.005–1.030)
pH: 7 (ref 5.0–8.0)

## 2019-12-14 LAB — HCG, QUANTITATIVE, PREGNANCY: hCG, Beta Chain, Quant, S: 33897 m[IU]/mL — ABNORMAL HIGH (ref <5)

## 2019-12-14 NOTE — Discharge Instructions (Signed)

## 2019-12-14 NOTE — MAU Provider Note (Signed)
History     CSN: [id number]  Arrival date and time: 12/14/19 1944   First Provider Initiated Contact with Patient 12/14/19 2020      Chief Complaint  Patient presents with  . Vaginal Bleeding   19 y.o. G2P1010 @[redacted]w[redacted]d  with confirmed IUP presenting with VB. Reports a large gush of blood around 1830 and passed something into the toilet. Bleeding has not continued. No recent IC. Denies abd pain.   OB History    Gravida  2   Para  0   Term      Preterm      AB  1   Living  0     SAB  1   TAB      Ectopic      Multiple      Live Births              Past Medical History:  Diagnosis Date  . Medical history non-contributory     Past Surgical History:  Procedure Laterality Date  . NO PAST SURGERIES      Family History  Family history unknown: Yes    Social History   Tobacco Use  . Smoking status: Never Smoker  . Smokeless tobacco: Never Used  Substance Use Topics  . Alcohol use: Never  . Drug use: Not Currently    Types: Marijuana    Allergies: No Known Allergies  Medications Prior to Admission  Medication Sig Dispense Refill Last Dose  . metroNIDAZOLE (FLAGYL) 500 MG tablet Take 500 mg by mouth 2 (two) times daily.   12/13/2019 at Unknown time  . prenatal vitamin w/FE, FA (NATACHEW) 29-1 MG CHEW chewable tablet Chew 1 tablet by mouth daily at 12 noon.   12/14/2019 at Unknown time    Review of Systems  Constitutional: Negative for chills and fever.  Gastrointestinal: Negative for abdominal pain.  Genitourinary: Positive for vaginal bleeding.   Physical Exam   Blood pressure 138/77, pulse 67, temperature 98.2 F (36.8 C), temperature source Oral, resp. rate 17, last menstrual period 08/30/2019.  Physical Exam  Nursing note and vitals reviewed. Constitutional: She is oriented to person, place, and time. She appears well-developed and well-nourished. No distress.  HENT:  Head: Normocephalic and atraumatic.  Cardiovascular: Normal rate.   Respiratory: Effort normal. No respiratory distress.  GI: Soft. She exhibits no distension and no mass. There is no abdominal tenderness. There is no rebound and no guarding.  Genitourinary:    Genitourinary Comments: External: no lesions or erythema Vagina: rugated, pink, moist, small amt drk bloody discharge Uterus: non enlarged, anteverted, non tender, no CMT Adnexae: no masses, no tenderness left, no tenderness right Cervix closed    Musculoskeletal:        General: Normal range of motion.     Cervical back: Normal range of motion.  Neurological: She is alert and oriented to person, place, and time.  Skin: Skin is warm and dry.  Psychiatric: She has a normal mood and affect.   Results for orders placed or performed during the hospital encounter of 12/14/19 (from the past 24 hour(s))  Urinalysis, Routine w reflex microscopic     Status: Abnormal   Collection Time: 12/14/19  7:57 PM  Result Value Ref Range   Color, Urine AMBER (A) YELLOW   APPearance CLOUDY (A) CLEAR   Specific Gravity, Urine 1.008 1.005 - 1.030   pH 7.0 5.0 - 8.0   Glucose, UA NEGATIVE NEGATIVE mg/dL   Hgb urine dipstick LARGE (A)  NEGATIVE   Bilirubin Urine NEGATIVE NEGATIVE   Ketones, ur NEGATIVE NEGATIVE mg/dL   Protein, ur 100 (A) NEGATIVE mg/dL   Nitrite NEGATIVE NEGATIVE   Leukocytes,Ua NEGATIVE NEGATIVE   RBC / HPF >50 (H) 0 - 5 RBC/hpf   Bacteria, UA FEW (A) NONE SEEN   Squamous Epithelial / LPF 11-20 0 - 5   Budding Yeast PRESENT   CBC     Status: Abnormal   Collection Time: 12/14/19  8:28 PM  Result Value Ref Range   WBC 8.0 4.0 - 10.5 K/uL   RBC 3.72 (L) 3.87 - 5.11 MIL/uL   Hemoglobin 11.3 (L) 12.0 - 15.0 g/dL   HCT 32.6 (L) 36.0 - 46.0 %   MCV 87.6 80.0 - 100.0 fL   MCH 30.4 26.0 - 34.0 pg   MCHC 34.7 30.0 - 36.0 g/dL   RDW 11.2 (L) 11.5 - 15.5 %   Platelets 252 150 - 400 K/uL   nRBC 0.0 0.0 - 0.2 %   US OB Transvaginal  Result Date: 12/14/2019 CLINICAL DATA:  Bleeding EXAM:  TRANSVAGINAL OB ULTRASOUND TECHNIQUE: Transvaginal ultrasound was performed for complete evaluation of the gestation as well as the maternal uterus, adnexal regions, and pelvic cul-de-sac. COMPARISON:  12/09/2019 FINDINGS: Intrauterine gestational sac: Single Yolk sac:  Visualized Embryo:  Visualized Cardiac Activity: Visualized Heart Rate: 148 bpm MSD:   mm    w     d CRL:   10.2 mm   7 w 1 d                  [place] EDC: 07/31/2020 Subchorionic hemorrhage:  None visualized. Maternal uterus/adnexae: Right corpus luteal cyst. No adnexal mass or abnormal free fluid. IMPRESSION: Seven week 1 day intrauterine pregnancy based on crown-rump length. Fetal heart rate 148 beats per minute. No acute maternal findings. Electronically Signed   By: Rolm Baptise M.D.   On: 12/14/2019 20:56   MAU Course  Procedures  MDM Labs and [place] ordered and reviewed. Viable IUP on [place]. Unclear cause of bleeding. Pt reassured. Discussed return precautions. Stable for discharge home.   Assessment and Plan   1. [redacted] weeks gestation of pregnancy   2. Normal intrauterine pregnancy on prenatal ultrasound in first trimester   3. Blood type, Rh positive    Discharge home Follow up at [facility] in 2 weeks as scheduled Pelvic rest SAB precautions  Allergies as of 12/14/2019   No Known Allergies     Medication List    STOP taking these medications   metroNIDAZOLE 500 MG tablet Commonly known as: FLAGYL     TAKE these medications   prenatal vitamin w/FE, FA 29-1 MG Chew chewable tablet Chew 1 tablet by mouth daily at 12 noon.      Julianne Handler, CNM 12/14/2019, 9:24 PM

## 2019-12-14 NOTE — ED Triage Notes (Signed)
Pt states she is [redacted] weeks pregnant. Pt states that she just went to the bathroom and a large clot fell out of her. Pt was unable to take a good look before it flushed. This is pt's second pregnancy, first she miscarried.

## 2019-12-14 NOTE — MAU Note (Addendum)
Patient reports to MAU c/o vaginal bleeding pt states the bleeding started about a hour ago and it went through her pants and on to her car seat. Pt reports she was at the other ED and they gave her a pad and It did not have blood on it when she used the BR. However when she peed there was blood in the toilet. Pt denies abdominal pain. Last intercourse 12/03/19.pt states she felt something "come out of her" but the automatic flush flushed before she could see it.

## 2019-12-17 ENCOUNTER — Other Ambulatory Visit: Payer: Self-pay

## 2019-12-17 ENCOUNTER — Inpatient Hospital Stay (HOSPITAL_COMMUNITY): Payer: Medicaid Other

## 2019-12-17 ENCOUNTER — Inpatient Hospital Stay (HOSPITAL_COMMUNITY)
Admission: AD | Admit: 2019-12-17 | Discharge: 2019-12-17 | Disposition: A | Discharge status: Home / Self Care | Payer: Medicaid Other | Attending: Obstetrics and Gynecology | Admitting: Obstetrics and Gynecology

## 2019-12-17 ENCOUNTER — Encounter (HOSPITAL_COMMUNITY): Payer: Self-pay | Admitting: Obstetrics and Gynecology

## 2019-12-17 DIAGNOSIS — Z87891 Personal history of nicotine dependence: Secondary | ICD-10-CM | POA: Diagnosis not present

## 2019-12-17 DIAGNOSIS — Z6790 Unspecified blood type, Rh positive: Secondary | ICD-10-CM | POA: Insufficient documentation

## 2019-12-17 DIAGNOSIS — O26891 Other specified pregnancy related conditions, first trimester: Secondary | ICD-10-CM | POA: Diagnosis not present

## 2019-12-17 DIAGNOSIS — Z3A01 Less than 8 weeks gestation of pregnancy: Secondary | ICD-10-CM | POA: Insufficient documentation

## 2019-12-17 DIAGNOSIS — O418X10 Other specified disorders of amniotic fluid and membranes, first trimester, not applicable or unspecified: Secondary | ICD-10-CM | POA: Diagnosis not present

## 2019-12-17 DIAGNOSIS — O4690 Antepartum hemorrhage, unspecified, unspecified trimester: Secondary | ICD-10-CM

## 2019-12-17 DIAGNOSIS — O208 Other hemorrhage in early pregnancy: Secondary | ICD-10-CM | POA: Diagnosis not present

## 2019-12-17 DIAGNOSIS — O468X1 Other antepartum hemorrhage, first trimester: Secondary | ICD-10-CM | POA: Diagnosis not present

## 2019-12-17 LAB — CBC
HCT: 34.2 % — ABNORMAL LOW (ref 36.0–46.0)
Hemoglobin: 12 g/dL (ref 12.0–15.0)
MCH: 31 pg (ref 26.0–34.0)
MCHC: 35.1 g/dL (ref 30.0–36.0)
MCV: 88.4 fL (ref 80.0–100.0)
Platelets: 273 10*3/uL (ref 150–400)
RBC: 3.87 MIL/uL (ref 3.87–5.11)
RDW: 11.3 % — ABNORMAL LOW (ref 11.5–15.5)
WBC: 9.4 10*3/uL (ref 4.0–10.5)
nRBC: 0 % (ref 0.0–0.2)

## 2019-12-17 LAB — HCG, QUANTITATIVE, PREGNANCY: hCG, Beta Chain, Quant, S: 40840 m[IU]/mL — ABNORMAL HIGH (ref <5)

## 2019-12-17 NOTE — Discharge Instructions (Signed)
Miscarriage A miscarriage is the loss of an unborn baby (fetus) before the 20th week of pregnancy. Most miscarriages happen during the first 3 months of pregnancy. Sometimes, a miscarriage can happen before a woman knows that she is pregnant. Having a miscarriage can be an emotional experience. If you have had a miscarriage, talk with your health care provider about any questions you may have about miscarrying, the grieving process, and your plans for future pregnancy. What are the causes? A miscarriage may be caused by:  Problems with the genes or chromosomes of the fetus. These problems make it impossible for the baby to develop normally. They are often the result of random errors that occur early in the development of the baby, and are not passed from parent to child (not inherited).  Infection of the cervix or uterus.  Conditions that affect hormone balance in the body.  Problems with the cervix, such as the cervix opening and thinning before pregnancy is at term (cervical insufficiency).  Problems with the uterus. These may include: ? A uterus with an abnormal shape. ? Fibroids in the uterus. ? Congenital abnormalities. These are problems that were present at birth.  Certain medical conditions.  Smoking, drinking alcohol, or using drugs.  Injury (trauma). In many cases, the cause of a miscarriage is not known. What are the signs or symptoms? Symptoms of this condition include:  Vaginal bleeding or spotting, with or without cramps or pain.  Pain or cramping in the abdomen or lower back.  Passing fluid, tissue, or blood clots from the vagina. How is this diagnosed? This condition may be diagnosed based on:  A physical exam.  Ultrasound.  Blood tests.  Urine tests. How is this treated? Treatment for a miscarriage is sometimes not necessary if you naturally pass all the tissue that was in your uterus. If necessary, this condition may be treated with:  Dilation and  curettage (D&C). This is a procedure in which the cervix is stretched open and the lining of the uterus (endometrium) is scraped. This is done only if tissue from the fetus or placenta remains in the body (incomplete miscarriage).  Medicines, such as: ? Antibiotic medicine, to treat infection. ? Medicine to help the body pass any remaining tissue. ? Medicine to reduce (contract) the size of the uterus. These medicines may be given if you have a lot of bleeding. If you have Rh negative blood and your baby was Rh positive, you will need a shot of a medicine called Rh immunoglobulinto protect your future babies from Rh blood problems. "Rh-negative" and "Rh-positive" refer to whether or not the blood has a specific protein found on the surface of red blood cells (Rh factor). Follow these instructions at home: Medicines   Take over-the-counter and prescription medicines only as told by your health care provider.  If you were prescribed antibiotic medicine, take it as told by your health care provider. Do not stop taking the antibiotic even if you start to feel better.  Do not take NSAIDs, such as aspirin and ibuprofen, unless they are approved by your health care provider. These medicines can cause bleeding. Activity  Rest as directed. Ask your health care provider what activities are safe for you.  Have someone help with home and family responsibilities during this time. General instructions  Keep track of the number of sanitary pads you use each day and how soaked (saturated) they are. Write down this information.  Monitor the amount of tissue or blood clots that   you pass from your vagina. Save any large amounts of tissue for your health care provider to examine.  Do not use tampons, douche, or have sex until your health care provider approves.  To help you and your partner with the process of grieving, talk with your health care provider or seek counseling.  When you are ready, meet with  your health care provider to discuss any important steps you should take for your health. Also, discuss steps you should take to have a healthy pregnancy in the future.  Keep all follow-up visits as told by your health care provider. This is important. Where to find more information  The American Congress of Obstetricians and Gynecologists: www.acog.org  [place] Department of Health and Cytogeneticist of Women's Health: http://hoffman.com/ Contact a health care provider if:  You have a fever or chills.  You have a foul smelling vaginal discharge.  You have more bleeding instead of less. Get help right away if:  You have severe cramps or pain in your back or abdomen.  You pass blood clots or tissue from your vagina that is walnut-sized or larger.  You soak more than 1 regular sanitary pad in an hour.  You become light-headed or weak.  You pass out.  You have feelings of sadness that take over your thoughts, or you have thoughts of hurting yourself. Summary  Most miscarriages happen in the first 3 months of pregnancy. Sometimes miscarriage happens before a woman even knows that she is pregnant.  Follow your health care provider's instruction for home care. Keep all follow-up appointments.  To help you and your partner with the process of grieving, talk with your health care provider or seek counseling. This information is not intended to replace advice given to you by your health care provider. Make sure you discuss any questions you have with your health care provider. Document Revised: 03/12/2019 Document Reviewed: 12/24/2016 Elsevier Patient Education  2020 [employer].                  Safe Medications in Pregnancy    Acne: Benzoyl Peroxide Salicylic Acid  Backache/Headache: Tylenol: 2 regular strength every 4 hours OR              2 Extra strength every 6 hours  Colds/Coughs/Allergies: Benadryl (alcohol free) 25 mg every 6 hours as needed Breath right  strips Claritin Cepacol throat lozenges Chloraseptic throat spray Cold-Eeze- up to three times per day Cough drops, alcohol free Flonase (by prescription only) Guaifenesin Mucinex Robitussin DM (plain only, alcohol free) Saline nasal spray/drops Sudafed (pseudoephedrine) & Actifed ** use only after [redacted] weeks gestation and if you do not have high blood pressure Tylenol Vicks Vaporub Zinc lozenges Zyrtec   Constipation: Colace Ducolax suppositories Fleet enema Glycerin suppositories Metamucil Milk of magnesia Miralax Senokot Smooth move tea  Diarrhea: Kaopectate Imodium A-D  *NO pepto Bismol  Hemorrhoids: Anusol Anusol HC Preparation H Tucks  Indigestion: Tums Maalox Mylanta Zantac  Pepcid  Insomnia: Benadryl (alcohol free) 25mg  every 6 hours as needed Tylenol PM Unisom, no Gelcaps  Leg Cramps: Tums MagGel  Nausea/Vomiting:  Bonine Dramamine Emetrol Ginger extract Sea bands Meclizine  Nausea medication to take during pregnancy:  Unisom (doxylamine succinate 25 mg tablets) Take one tablet daily at bedtime. If symptoms are not adequately controlled, the dose can be increased to a maximum recommended dose of two tablets daily (1/2 tablet in the morning, 1/2 tablet mid-afternoon and one at bedtime). Vitamin B6 100mg  tablets.  Take one tablet twice a day (up to 200 mg per day).  Skin Rashes: Aveeno products Benadryl cream or 25mg  every 6 hours as needed Calamine Lotion 1% cortisone cream  Yeast infection: Gyne-lotrimin 7 Monistat 7   **If taking multiple medications, please check labels to avoid duplicating the same active ingredients **take medication as directed on the label ** Do not exceed 4000 mg of tylenol in 24 hours **Do not take medications that contain aspirin or ibuprofen      Vaginal Bleeding During Pregnancy, First Trimester  A small amount of bleeding from the vagina (spotting) is relatively common during early pregnancy.  It usually stops on its own. Various things may cause bleeding or spotting during early pregnancy. Some bleeding may be related to the pregnancy, and some may not. In many cases, the bleeding is normal and is not a problem. However, bleeding can also be a sign of something serious. Be sure to tell your health care provider about any vaginal bleeding right away. Some possible causes of vaginal bleeding during the first trimester include:  Infection or inflammation of the cervix.  Growths (polyps) on the cervix.  Miscarriage or threatened miscarriage.  Pregnancy tissue developing outside of the uterus (ectopic pregnancy).  A mass of tissue developing in the uterus due to an egg being fertilized incorrectly (molar pregnancy). Follow these instructions at home: Activity  Follow instructions from your health care provider about limiting your activity. Ask what activities are safe for you.  If needed, make plans for someone to help with your regular activities.  Do not have sex or orgasms until your health care provider says that this is safe. General instructions  Take over-the-counter and prescription medicines only as told by your health care provider.  Pay attention to any changes in your symptoms.  Do not use tampons or douche.  Write down how many pads you use each day, how often you change pads, and how soaked (saturated) they are.  If you pass any tissue from your vagina, save the tissue so you can show it to your health care provider.  Keep all follow-up visits as told by your health care provider. This is important. Contact a health care provider if:  You have vaginal bleeding during any part of your pregnancy.  You have cramps or labor pains.  You have a fever. Get help right away if:  You have severe cramps in your back or abdomen.  You pass large clots or a large amount of tissue from your vagina.  Your bleeding increases.  You feel light-headed or weak, or you  faint.  You have chills.  You are leaking fluid or have a gush of fluid from your vagina. Summary  A small amount of bleeding (spotting) from the vagina is relatively common during early pregnancy.  Various things may cause bleeding or spotting in early pregnancy.  Be sure to tell your health care provider about any vaginal bleeding right away. This information is not intended to replace advice given to you by your health care provider. Make sure you discuss any questions you have with your health care provider. Document Revised: 03/09/2019 Document Reviewed: 02/20/2017 Elsevier Patient Education  2020 02/22/2017. First Trimester of Pregnancy The first trimester of pregnancy is from week 1 until the end of week 13 (months 1 through 3). A week after a sperm fertilizes an egg, the egg will implant on the wall of the uterus. This embryo will begin to develop into a baby.  Genes from you and your partner will form the baby. The female genes will determine whether the baby will be a boy or a girl. At 6-8 weeks, the eyes and face will be formed, and the heartbeat can be seen on ultrasound. At the end of 12 weeks, all the baby's organs will be formed. Now that you are pregnant, you will want to do everything you can to have a healthy baby. Two of the most important things are to get good prenatal care and to follow your health care provider's instructions. Prenatal care is all the medical care you receive before the baby's birth. This care will help prevent, find, and treat any problems during the pregnancy and childbirth. Body changes during your first trimester Your body goes through many changes during pregnancy. The changes vary from woman to woman.  You may gain or lose a couple of pounds at first.  You may feel sick to your stomach (nauseous) and you may throw up (vomit). If the vomiting is uncontrollable, call your health care provider.  You may tire easily.  You may develop headaches that  can be relieved by medicines. All medicines should be approved by your health care provider.  You may urinate more often. Painful urination may mean you have a bladder infection.  You may develop heartburn as a result of your pregnancy.  You may develop constipation because certain hormones are causing the muscles that push stool through your intestines to slow down.  You may develop hemorrhoids or swollen veins (varicose veins).  Your breasts may begin to grow larger and become tender. Your nipples may stick out more, and the tissue that surrounds them (areola) may become darker.  Your gums may bleed and may be sensitive to brushing and flossing.  Dark spots or blotches (chloasma, mask of pregnancy) may develop on your face. This will likely fade after the baby is born.  Your menstrual periods will stop.  You may have a loss of appetite.  You may develop cravings for certain kinds of food.  You may have changes in your emotions from day to day, such as being excited to be pregnant or being concerned that something may go wrong with the pregnancy and baby.  You may have more vivid and strange dreams.  You may have changes in your hair. These can include thickening of your hair, rapid growth, and changes in texture. Some women also have hair loss during or after pregnancy, or hair that feels dry or thin. Your hair will most likely return to normal after your baby is born. What to expect at prenatal visits During a routine prenatal visit:  You will be weighed to make sure you and the baby are growing normally.  Your blood pressure will be taken.  Your abdomen will be measured to track your baby's growth.  The fetal heartbeat will be listened to between weeks 10 and 14 of your pregnancy.  Test results from any previous visits will be discussed. Your health care provider may ask you:  How you are feeling.  If you are feeling the baby move.  If you have had any abnormal  symptoms, such as leaking fluid, bleeding, severe headaches, or abdominal cramping.  If you are using any tobacco products, including cigarettes, chewing tobacco, and electronic cigarettes.  If you have any questions. Other tests that may be performed during your first trimester include:  Blood tests to find your blood type and to check for the presence of any  previous infections. The tests will also be used to check for low iron levels (anemia) and protein on red blood cells (Rh antibodies). Depending on your risk factors, or if you previously had diabetes during pregnancy, you may have tests to check for high blood sugar that affects pregnant women (gestational diabetes).  Urine tests to check for infections, diabetes, or protein in the urine.  An ultrasound to confirm the proper growth and development of the baby.  Fetal screens for spinal cord problems (spina bifida) and Down syndrome.  HIV (human immunodeficiency virus) testing. Routine prenatal testing includes screening for HIV, unless you choose not to have this test.  You may need other tests to make sure you and the baby are doing well. Follow these instructions at home: Medicines  Follow your health care provider's instructions regarding medicine use. Specific medicines may be either safe or unsafe to take during pregnancy.  Take a prenatal vitamin that contains at least 600 micrograms (mcg) of folic acid.  If you develop constipation, try taking a stool softener if your health care provider approves. Eating and drinking   Eat a balanced diet that includes fresh fruits and vegetables, whole grains, good sources of protein such as meat, eggs, or tofu, and low-fat dairy. Your health care provider will help you determine the amount of weight gain that is right for you.  Avoid raw meat and uncooked cheese. These carry germs that can cause birth defects in the baby.  Eating four or five small meals rather than three large meals  a day may help relieve nausea and vomiting. If you start to feel nauseous, eating a few soda crackers can be helpful. Drinking liquids between meals, instead of during meals, also seems to help ease nausea and vomiting.  Limit foods that are high in fat and processed sugars, such as fried and sweet foods.  To prevent constipation: ? Eat foods that are high in fiber, such as fresh fruits and vegetables, whole grains, and beans. ? Drink enough fluid to keep your urine clear or pale yellow. Activity  Exercise only as directed by your health care provider. Most women can continue their usual exercise routine during pregnancy. Try to exercise for 30 minutes at least 5 days a week. Exercising will help you: ? Control your weight. ? Stay in shape. ? Be prepared for labor and delivery.  Experiencing pain or cramping in the lower abdomen or lower back is a good sign that you should stop exercising. Check with your health care provider before continuing with normal exercises.  Try to avoid standing for long periods of time. Move your legs often if you must stand in one place for a long time.  Avoid heavy lifting.  Wear low-heeled shoes and practice good posture.  You may continue to have sex unless your health care provider tells you not to. Relieving pain and discomfort  Wear a good support bra to relieve breast tenderness.  Take warm sitz baths to soothe any pain or discomfort caused by hemorrhoids. Use hemorrhoid cream if your health care provider approves.  Rest with your legs elevated if you have leg cramps or low back pain.  If you develop varicose veins in your legs, wear support hose. Elevate your feet for 15 minutes, 3-4 times a day. Limit salt in your diet. Prenatal care  Schedule your prenatal visits by the twelfth week of pregnancy. They are usually scheduled monthly at first, then more often in the last 2 months before delivery.  Write down your questions. Take them to your  prenatal visits.  Keep all your prenatal visits as told by your health care provider. This is important. Safety  Wear your seat belt at all times when driving.  Make a list of emergency phone numbers, including numbers for family, friends, the hospital, and police and fire departments. General instructions  Ask your health care provider for a referral to a local prenatal education class. Begin classes no later than the beginning of month 6 of your pregnancy.  Ask for help if you have counseling or nutritional needs during pregnancy. Your health care provider can offer advice or refer you to specialists for help with various needs.  Do not use hot tubs, steam rooms, or saunas.  Do not douche or use tampons or scented sanitary pads.  Do not cross your legs for long periods of time.  Avoid cat litter boxes and soil used by cats. These carry germs that can cause birth defects in the baby and possibly loss of the fetus by miscarriage or stillbirth.  Avoid all smoking, herbs, alcohol, and medicines not prescribed by your health care provider. Chemicals in these products affect the formation and growth of the baby.  Do not use any products that contain nicotine or tobacco, such as cigarettes and e-cigarettes. If you need help quitting, ask your health care provider. You may receive counseling support and other resources to help you quit.  Schedule a dentist appointment. At home, brush your teeth with a soft toothbrush and be gentle when you floss. Contact a health care provider if:  You have dizziness.  You have mild pelvic cramps, pelvic pressure, or nagging pain in the abdominal area.  You have persistent nausea, vomiting, or diarrhea.  You have a bad smelling vaginal discharge.  You have pain when you urinate.  You notice increased swelling in your face, hands, legs, or ankles.  You are exposed to fifth disease or chickenpox.  You are exposed to [place] measles (rubella) and  have never had it. Get help right away if:  You have a fever.  You are leaking fluid from your vagina.  You have spotting or bleeding from your vagina.  You have severe abdominal cramping or pain.  You have rapid weight gain or loss.  You vomit blood or material that looks like coffee grounds.  You develop a severe headache.  You have shortness of breath.  You have any kind of trauma, such as from a fall or a car accident. Summary  The first trimester of pregnancy is from week 1 until the end of week 13 (months 1 through 3).  Your body goes through many changes during pregnancy. The changes vary from woman to woman.  You will have routine prenatal visits. During those visits, your health care provider will examine you, discuss any test results you may have, and talk with you about how you are feeling. This information is not intended to replace advice given to you by your health care provider. Make sure you discuss any questions you have with your health care provider. Document Revised: 10/31/2017 Document Reviewed: 10/30/2016 Elsevier Patient Education  2020 [employer] Subchorionic Hematoma  A subchorionic hematoma is a gathering of blood between the outer wall of the embryo (chorion) and the inner wall of the womb (uterus). This condition can cause vaginal bleeding. If they cause little or no vaginal bleeding, early small hematomas usually shrink on their own and do not affect your baby or pregnancy.  When bleeding starts later in pregnancy, or if the hematoma is larger or occurs in older pregnant women, the condition may be more serious. Larger hematomas may get bigger, which increases the chances of miscarriage. This condition also increases the risk of:  Premature separation of the placenta from the uterus.  Premature (preterm) labor.  Stillbirth. What are the causes? The exact cause of this condition is not known. It occurs when blood is trapped between the placenta  and the uterine wall because the placenta has separated from the original site of implantation. What increases the risk? You are more likely to develop this condition if:  You were treated with fertility medicines.  You conceived through in vitro fertilization (IVF). What are the signs or symptoms? Symptoms of this condition include:  Vaginal spotting or bleeding.  Contractions of the uterus. These cause abdominal pain. Sometimes you may have no symptoms and the bleeding may only be seen when ultrasound images are taken (transvaginal ultrasound). How is this diagnosed? This condition is diagnosed based on a physical exam. This includes a pelvic exam. You may also have other tests, including:  Blood tests.  Urine tests.  Ultrasound of the abdomen. How is this treated? Treatment for this condition can vary. Treatment may include:  Watchful waiting. You will be monitored closely for any changes in bleeding. During this stage: ? The hematoma may be reabsorbed by the body. ? The hematoma may separate the fluid-filled space containing the embryo (gestational sac) from the wall of the womb (endometrium).  Medicines.  Activity restriction. This may be needed until the bleeding stops. Follow these instructions at home:  Stay on bed rest if told to do so by your health care provider.  Do not lift anything that is heavier than 10 lbs. (4.5 kg) or as told by your health care provider.  Do not use any products that contain nicotine or tobacco, such as cigarettes and e-cigarettes. If you need help quitting, ask your health care provider.  Track and write down the number of pads you use each day and how soaked (saturated) they are.  Do not use tampons.  Keep all follow-up visits as told by your health care provider. This is important. Your health care provider may ask you to have follow-up blood tests or ultrasound tests or both. Contact a health care provider if:  You have any  vaginal bleeding.  You have a fever. Get help right away if:  You have severe cramps in your stomach, back, abdomen, or pelvis.  You pass large clots or tissue. Save any tissue for your health care provider to look at.  You have more vaginal bleeding, and you faint or become lightheaded or weak. Summary  A subchorionic hematoma is a gathering of blood between the outer wall of the placenta and the uterus.  This condition can cause vaginal bleeding.  Sometimes you may have no symptoms and the bleeding may only be seen when ultrasound images are taken.  Treatment may include watchful waiting, medicines, or activity restriction. This information is not intended to replace advice given to you by your health care provider. Make sure you discuss any questions you have with your health care provider. Document Revised: 10/31/2017 Document Reviewed: 01/14/2017 Elsevier Patient Education  2020 [employer] Abdominal Pain During Pregnancy  Abdominal pain is common during pregnancy, and has many possible causes. Some causes are more serious than others, and sometimes the cause is not known. Abdominal pain can be a sign that  labor is starting. It can also be caused by normal growth and stretching of muscles and ligaments during pregnancy. Always tell your health care provider if you have any abdominal pain. Follow these instructions at home:  Do not have sex or put anything in your vagina until your pain goes away completely.  Get plenty of rest until your pain improves.  Drink enough fluid to keep your urine pale yellow.  Take over-the-counter and prescription medicines only as told by your health care provider.  Keep all follow-up visits as told by your health care provider. This is important. Contact a health care provider if:  Your pain continues or gets worse after resting.  You have lower abdominal pain that: ? Comes and goes at regular intervals. ? Spreads to your back. ? Is  similar to menstrual cramps.  You have pain or burning when you urinate. Get help right away if:  You have a fever or chills.  You have vaginal bleeding.  You are leaking fluid from your vagina.  You are passing tissue from your vagina.  You have vomiting or diarrhea that lasts for more than 24 hours.  Your baby is moving less than usual.  You feel very weak or faint.  You have shortness of breath.  You develop severe pain in your upper abdomen. Summary  Abdominal pain is common during pregnancy, and has many possible causes.  If you experience abdominal pain during pregnancy, tell your health care provider right away.  Follow your health care provider's home care instructions and keep all follow-up visits as directed. This information is not intended to replace advice given to you by your health care provider. Make sure you discuss any questions you have with your health care provider. Document Revised: 03/08/2019 Document Reviewed: 02/20/2017 Elsevier Patient Education  2020 [employer].

## 2019-12-17 NOTE — MAU Note (Signed)
Bleeding still, heavier then she had been. About 30 min ago, passed a big blood clot.  Has been seen 3 times for this, wants to know what is going on . No pain.

## 2019-12-17 NOTE — MAU Provider Note (Signed)
History     CSN: [id number]  Arrival date and time: 12/17/19 1718   First Provider Initiated Contact with Patient 12/17/19 1903      Chief Complaint  Patient presents with  . Vaginal Bleeding   Ms. Bertine Schlottman is a 19 y.o. G2P0010 at [redacted]w[redacted]d who presents to MAU for vaginal bleeding which began 5PM today. Pt describes the bleeding as a "gush" with a "whole bunch of blood and a big blood clot coming through my tights." Pt reports she has used 3 pads since the bleeding started, but reports they were not filled with blood. Pt reports has been to MAU two other times this week for bleeding, which she reports was about the same as it is today. Pt has been previously diagnosed with an IUP.  Passing blood clots? yes - one big and one small Blood soaking clothes? yes Lightheaded/dizzy? no Significant pelvic pain or cramping? yes "hurting a little bit" Passed any tissue? no  Current pregnancy problems? first pregnancy, pt has not yet been seen Blood Type? O Positive Allergies? NKDA Current medications? PNVs [facility]  Pt denies vaginal discharge/odor/itching. Pt denies N/V, abdominal pain, constipation, diarrhea, or urinary problems. Pt denies fever, chills, fatigue, sweating or changes in appetite. Pt denies SOB or chest pain. Pt denies dizziness, HA, light-headedness, weakness.   OB History    Gravida  2   Para  0   Term      Preterm      AB  1   Living  0     SAB  1   TAB      Ectopic      Multiple      Live Births              Past Medical History:  Diagnosis Date  . Medical history non-contributory     Past Surgical History:  Procedure Laterality Date  . NO PAST SURGERIES      Family History  Family history unknown: Yes    Social History   Tobacco Use  . Smoking status: Former Smoker    Types: Cigars  . Smokeless tobacco: Never Used  Substance Use Topics  . Alcohol use: Never  . Drug use: Not  Currently    Types: Marijuana    Allergies: No Known Allergies  Medications Prior to Admission  Medication Sig Dispense Refill Last Dose  . prenatal vitamin w/FE, FA (NATACHEW) 29-1 MG CHEW chewable tablet Chew 1 tablet by mouth daily at 12 noon.       Review of Systems  Constitutional: Negative for chills, diaphoresis, fatigue and fever.  Eyes: Negative for visual disturbance.  Respiratory: Negative for shortness of breath.   Cardiovascular: Negative for chest pain.  Gastrointestinal: Negative for abdominal pain, constipation, diarrhea, nausea and vomiting.  Genitourinary: Positive for vaginal bleeding. Negative for dysuria, flank pain, frequency, urgency and vaginal discharge. Pelvic pain: (cramping)  Neurological: Negative for dizziness, weakness, light-headedness and headaches.   Physical Exam   Blood pressure 126/68, pulse 80, temperature 98.4 F (36.9 C), temperature source Oral, resp. rate 16, height 5\' 7"  (1.702 m), weight 63.2 kg, last menstrual period 08/30/2019, SpO2 100 %.  Patient Vitals for the past 24 hrs:  BP Temp Temp src Pulse Resp SpO2 Height Weight  12/17/19 1754 126/68 98.4 F (36.9 C) Oral 80 16 100 % 5\' 7"  (1.702 m) 63.2 kg   Physical Exam  Constitutional: She is oriented to person, place,  and time. She appears well-developed and well-nourished. No distress.  HENT:  Head: Normocephalic and atraumatic.  Respiratory: Effort normal.  GI: Soft. She exhibits no distension and no mass. There is no abdominal tenderness. There is no rebound and no guarding.  Neurological: She is alert and oriented to person, place, and time.  Skin: Skin is warm and dry. She is not diaphoretic.  Psychiatric: She has a normal mood and affect. Her behavior is normal. Judgment and thought content normal.  Pt declines pelvic exam.  Results for orders placed or performed during the hospital encounter of 12/17/19 (from the past 24 hour(s))  CBC     Status: Abnormal   Collection  Time: 12/17/19  7:13 PM  Result Value Ref Range   WBC 9.4 4.0 - 10.5 K/uL   RBC 3.87 3.87 - 5.11 MIL/uL   Hemoglobin 12.0 12.0 - 15.0 g/dL   HCT 34.2 (L) 36.0 - 46.0 %   MCV 88.4 80.0 - 100.0 fL   MCH 31.0 26.0 - 34.0 pg   MCHC 35.1 30.0 - 36.0 g/dL   RDW 11.3 (L) 11.5 - 15.5 %   Platelets 273 150 - 400 K/uL   nRBC 0.0 0.0 - 0.2 %  hCG, quantitative, pregnancy     Status: Abnormal   Collection Time: 12/17/19  7:13 PM  Result Value Ref Range   hCG, Beta Chain, Quant, S 40,840 (H) <5 mIU/mL   US OB Transvaginal  Result Date: 12/17/2019 CLINICAL DATA:  Pregnant patient in first-trimester pregnancy with vaginal bleeding. EXAM: TRANSVAGINAL OB ULTRASOUND TECHNIQUE: Transvaginal ultrasound was performed for complete evaluation of the gestation as well as the maternal uterus, adnexal regions, and pelvic cul-de-sac. COMPARISON:  Obstetric ultrasound 3 days ago 12/13/2018, additional priors. FINDINGS: Intrauterine gestational sac: Single Yolk sac:  Visualized. Embryo:  Visualized. Cardiac Activity: Visualized. Heart Rate: 150 bpm CRL:   11.32 mm   7 w 1 d                  [place] EDC: 08/03/2020 Subchorionic hemorrhage: Small, with additional heterogeneous material in the endometrium consistent with blood products/clot. Maternal uterus/adnexae: New heterogeneous material in the lower endometrial canal consistent with blood products/clot. Right ovary appears normal and contains a corpus luteal cyst. The left ovary is normal. Trace free fluid in the pelvis. IMPRESSION: 1. Single live intrauterine pregnancy estimated gestational age [redacted] weeks 1 day based on crown-rump length for ultrasound [facility] 08/03/2020. 2. Small subchorionic hemorrhage with additional echogenic material in the lower endometrial canal consistent with clot. This is new from exam 3 days ago. Electronically Signed   By: Keith Rake M.D.   On: 12/17/2019 20:02   US OB Transvaginal  Result Date: 12/14/2019 CLINICAL DATA:  Bleeding EXAM:  TRANSVAGINAL OB ULTRASOUND TECHNIQUE: Transvaginal ultrasound was performed for complete evaluation of the gestation as well as the maternal uterus, adnexal regions, and pelvic cul-de-sac. COMPARISON:  12/09/2019 FINDINGS: Intrauterine gestational sac: Single Yolk sac:  Visualized Embryo:  Visualized Cardiac Activity: Visualized Heart Rate: 148 bpm MSD:   mm    w     d CRL:   10.2 mm   7 w 1 d                  [place] EDC: 07/31/2020 Subchorionic hemorrhage:  None visualized. Maternal uterus/adnexae: Right corpus luteal cyst. No adnexal mass or abnormal free fluid. IMPRESSION: Seven week 1 day intrauterine pregnancy based on crown-rump length. Fetal heart rate 148 beats per minute.  No acute maternal findings. Electronically Signed   By: Charlett Nose M.D.   On: 12/14/2019 20:56   US OB LESS THAN 14 WEEKS WITH OB TRANSVAGINAL  Result Date: 12/09/2019 CLINICAL DATA:  Vaginal bleeding, prior miscarriage 10/16/2019 EXAM: OBSTETRIC <14 WK [place] AND TRANSVAGINAL OB US TECHNIQUE: Both transabdominal and transvaginal ultrasound examinations were performed for complete evaluation of the gestation as well as the maternal uterus, adnexal regions, and pelvic cul-de-sac. Transvaginal technique was performed to assess early pregnancy. COMPARISON:  Prior gestational ultrasound 10/13/2019 FINDINGS: Intrauterine gestational sac: Single Yolk sac:  Visualized. Embryo:  Visualized. Cardiac Activity: Visualized. Heart Rate: 111 bpm CRL:  3.3 mm   6 w   0 d                  [place] EDC: 08/03/2020 Subchorionic hemorrhage:  None visualized. Maternal uterus/adnexae: Normal appearance of the right ovary with a peripherally vascular corpus luteum cyst. The left ovary is not well visualized. No gross abnormality seen in the left adnexa. No free fluid in the pelvis. IMPRESSION: Single intrauterine gestation at 6 weeks 0 days by crown-rump length measurement. Borderline low fetal heart rate (111 beats per minute, normal > 110 bpm). Electronically Signed    By: Kreg Shropshire M.D.   On: 12/09/2019 05:00   MAU Course  Procedures  MDM -VB in pregnancy with previously established IUP -pt declines pelvic exam -CBC: WNL -[place]: single IUP, [redacted]w[redacted]d, FHR 150, sm subchorionic hemorrhage, trace free fluid -hCG: 40,840 -ABO: O Positive -pt discharged to home in stable condition  Orders Placed This Encounter  Procedures  . US OB Transvaginal    Standing Status:   Standing    Number of Occurrences:   1    Order Specific Question:   Symptom/Reason for Exam    Answer:   Vaginal bleeding in pregnancy [[id number]]  . CBC    Standing Status:   Standing    Number of Occurrences:   1  . hCG, quantitative, pregnancy    Standing Status:   Standing    Number of Occurrences:   1  . Discharge patient    Order Specific Question:   Discharge disposition    Answer:   01-Home or Self Care [1]    Order Specific Question:   Discharge patient date    Answer:   12/17/2019   Assessment and Plan   1. Subchorionic hemorrhage of placenta in first trimester, single or unspecified fetus   2. Vaginal bleeding in pregnancy   3. Blood type, Rh positive   4. [redacted] weeks gestation of pregnancy    Allergies as of 12/17/2019   No Known Allergies     Medication List    TAKE these medications   prenatal vitamin w/FE, FA 29-1 MG Chew chewable tablet Chew 1 tablet by mouth daily at 12 noon.      -discussed normal expectations with [facility], including bleeding and cramping -discussed SCH vs. miscarriage -advised pelvic rest until advised otherwise -bleeding/pain/return MAU precautions given -pt discharged to home in stable condition  Odie Sera Deborah Lazcano 12/17/2019, 8:47 PM

## 2019-12-25 ENCOUNTER — Inpatient Hospital Stay (HOSPITAL_COMMUNITY)
Admission: AD | Admit: 2019-12-25 | Discharge: 2019-12-25 | Disposition: A | Discharge status: Home / Self Care | Payer: Medicaid Other | Attending: Family Medicine | Admitting: Family Medicine

## 2019-12-25 ENCOUNTER — Encounter (HOSPITAL_COMMUNITY): Payer: Self-pay | Admitting: Family Medicine

## 2019-12-25 ENCOUNTER — Other Ambulatory Visit: Payer: Self-pay

## 2019-12-25 DIAGNOSIS — R1032 Left lower quadrant pain: Secondary | ICD-10-CM | POA: Diagnosis not present

## 2019-12-25 DIAGNOSIS — Z87891 Personal history of nicotine dependence: Secondary | ICD-10-CM | POA: Insufficient documentation

## 2019-12-25 DIAGNOSIS — O26851 Spotting complicating pregnancy, first trimester: Secondary | ICD-10-CM

## 2019-12-25 DIAGNOSIS — Z3A08 8 weeks gestation of pregnancy: Secondary | ICD-10-CM | POA: Diagnosis not present

## 2019-12-25 DIAGNOSIS — N898 Other specified noninflammatory disorders of vagina: Secondary | ICD-10-CM | POA: Diagnosis present

## 2019-12-25 LAB — URINALYSIS, ROUTINE W REFLEX MICROSCOPIC
Bacteria, UA: NONE SEEN
Bilirubin Urine: NEGATIVE
Glucose, UA: NEGATIVE mg/dL
Ketones, ur: NEGATIVE mg/dL
Nitrite: NEGATIVE
Protein, ur: NEGATIVE mg/dL
Specific Gravity, Urine: 1.014 (ref 1.005–1.030)
pH: 7 (ref 5.0–8.0)

## 2019-12-25 LAB — WET PREP, GENITAL
Clue Cells Wet Prep HPF POC: NONE SEEN
Sperm: NONE SEEN
Trich, Wet Prep: NONE SEEN
Yeast Wet Prep HPF POC: NONE SEEN

## 2019-12-25 NOTE — MAU Note (Signed)
Alicia Black is a 19 y.o. at [redacted]w[redacted]d here in MAU reporting:  +brown discharge +vaginal burning +lower left abdominal pain. Intermittent. Onset of complaint: 1.5 weeks Pain score:  5/10 Patient requesting that she be checked for BV and have an ultrasound. Vitals:   12/25/19 0926  BP: (!) 140/50  Pulse: 87  Resp: 16  Temp: 98.2 F (36.8 C)  SpO2: 100%    Lab orders placed from triage: ua

## 2019-12-25 NOTE — Discharge Instructions (Signed)

## 2019-12-25 NOTE — MAU Provider Note (Signed)
History     CSN: [id number]  Arrival date and time: 12/25/19 1517   First Provider Initiated Contact with Patient 12/25/19 [phone]      Chief Complaint  Patient presents with  . Vaginal Discharge  . Abdominal Pain   19 y.o. G2P0010 @[redacted]w[redacted]d  with known IUP and [facility] presenting with brown vaginal discharge and vaginal burning. Sx started 1 wk ago. Discharge is near black at times. Reports malodor and itching. Vaginal burning is constant and worse with urination. Denies lesion. No new products or detergents. No new partner and no recent IC. Also reports intermittent LLQ pain. Rates 5/10. Has not taken anything for it. Denies urinary sx. No fevers.   OB History    Gravida  2   Para  0   Term      Preterm      AB  1   Living  0     SAB  1   TAB      Ectopic      Multiple      Live Births              Past Medical History:  Diagnosis Date  . Medical history non-contributory     Past Surgical History:  Procedure Laterality Date  . NO PAST SURGERIES      Family History  Family history unknown: Yes    Social History   Tobacco Use  . Smoking status: Former Smoker    Types: Cigars  . Smokeless tobacco: Never Used  Substance Use Topics  . Alcohol use: Never  . Drug use: Not Currently    Types: Marijuana    Allergies: No Known Allergies  No medications prior to admission.    Review of Systems  Constitutional: Negative for chills and fever.  Gastrointestinal: Negative for constipation, diarrhea, nausea and vomiting.  Genitourinary: Positive for vaginal discharge and vaginal pain. Negative for dysuria, urgency and vaginal bleeding.   Physical Exam   Blood pressure 132/61, pulse 71, temperature 98.2 F (36.8 C), temperature source Oral, resp. rate 16, weight 63.5 kg, last menstrual period 08/30/2019, SpO2 100 %.  Physical Exam  Nursing note and vitals reviewed. Constitutional: She is oriented to person, place, and time. She appears well-developed and  well-nourished. No distress.  HENT:  Head: Normocephalic and atraumatic.  Cardiovascular: Normal rate.  Respiratory: Effort normal. No respiratory distress.  GI: Soft. She exhibits no distension and no mass. There is no abdominal tenderness. There is no rebound and no guarding.  Genitourinary:    Genitourinary Comments: External: no lesions or erythema but several tiny superficial excoriations near introitus Vagina: rugated, pink, moist, small brown bloody discharge Cervix visually closed    Musculoskeletal:        General: Normal range of motion.     Cervical back: Normal range of motion.  Neurological: She is alert and oriented to person, place, and time.  Skin: Skin is warm and dry.  Psychiatric: She has a normal mood and affect.  FHT by doppler 177  Results for orders placed or performed during the hospital encounter of 12/25/19 (from the past 24 hour(s))  Urinalysis, Routine w reflex microscopic     Status: Abnormal   Collection Time: 12/25/19  9:43 AM  Result Value Ref Range   Color, Urine YELLOW YELLOW   APPearance HAZY (A) CLEAR   Specific Gravity, Urine 1.014 1.005 - 1.030   pH 7.0 5.0 - 8.0   Glucose, UA NEGATIVE NEGATIVE mg/dL   Hgb  urine dipstick MODERATE (A) NEGATIVE   Bilirubin Urine NEGATIVE NEGATIVE   Ketones, ur NEGATIVE NEGATIVE mg/dL   Protein, ur NEGATIVE NEGATIVE mg/dL   Nitrite NEGATIVE NEGATIVE   Leukocytes,Ua TRACE (A) NEGATIVE   RBC / HPF 0-5 0 - 5 RBC/hpf   WBC, UA 0-5 0 - 5 WBC/hpf   Bacteria, UA NONE SEEN NONE SEEN   Squamous Epithelial / LPF 6-10 0 - 5   Mucus PRESENT   Wet prep, genital     Status: Abnormal   Collection Time: 12/25/19 10:07 AM  Result Value Ref Range   Yeast Wet Prep HPF POC NONE SEEN NONE SEEN   Trich, Wet Prep NONE SEEN NONE SEEN   Clue Cells Wet Prep HPF POC NONE SEEN NONE SEEN   WBC, Wet Prep HPF POC MANY (A) NONE SEEN   Sperm NONE SEEN    MAU Course  Procedures  MDM Labs ordered and reviewed. No evidence of UTI or  vaginal infection. Recommend sitz baths with baking soda, no soap to area, and pelvic rest for now. Stable for discharge home.   Assessment and Plan   1. [redacted] weeks gestation of pregnancy   2. Vaginal irritation   3. Spotting affecting pregnancy in first trimester    Discharge home Follow up at [facility] as scheduled SAB precautions  Allergies as of 12/25/2019   No Known Allergies     Medication List    TAKE these medications   prenatal vitamin w/FE, FA 29-1 MG Chew chewable tablet Chew 1 tablet by mouth daily at 12 noon.      Donette Larry, CNM 12/25/2019, 12:00 PM

## 2020-01-10 ENCOUNTER — Encounter (HOSPITAL_COMMUNITY): Payer: Self-pay | Admitting: Obstetrics and Gynecology

## 2020-01-10 ENCOUNTER — Inpatient Hospital Stay (HOSPITAL_COMMUNITY): Payer: Medicaid Other

## 2020-01-10 ENCOUNTER — Other Ambulatory Visit: Payer: Self-pay

## 2020-01-10 ENCOUNTER — Inpatient Hospital Stay (HOSPITAL_COMMUNITY)
Admission: AD | Admit: 2020-01-10 | Discharge: 2020-01-10 | Disposition: A | Discharge status: Home / Self Care | Payer: Medicaid Other | Attending: Obstetrics and Gynecology | Admitting: Obstetrics and Gynecology

## 2020-01-10 DIAGNOSIS — O4690 Antepartum hemorrhage, unspecified, unspecified trimester: Secondary | ICD-10-CM

## 2020-01-10 DIAGNOSIS — O468X1 Other antepartum hemorrhage, first trimester: Secondary | ICD-10-CM | POA: Diagnosis not present

## 2020-01-10 DIAGNOSIS — Z113 Encounter for screening for infections with a predominantly sexual mode of transmission: Secondary | ICD-10-CM | POA: Diagnosis not present

## 2020-01-10 DIAGNOSIS — O418X10 Other specified disorders of amniotic fluid and membranes, first trimester, not applicable or unspecified: Secondary | ICD-10-CM | POA: Diagnosis not present

## 2020-01-10 DIAGNOSIS — Z3A10 10 weeks gestation of pregnancy: Secondary | ICD-10-CM | POA: Diagnosis not present

## 2020-01-10 DIAGNOSIS — Z3200 Encounter for pregnancy test, result unknown: Secondary | ICD-10-CM | POA: Diagnosis not present

## 2020-01-10 DIAGNOSIS — A545 Gonococcal pharyngitis: Secondary | ICD-10-CM | POA: Diagnosis not present

## 2020-01-10 DIAGNOSIS — O208 Other hemorrhage in early pregnancy: Secondary | ICD-10-CM | POA: Insufficient documentation

## 2020-01-10 DIAGNOSIS — Z87891 Personal history of nicotine dependence: Secondary | ICD-10-CM | POA: Diagnosis not present

## 2020-01-10 DIAGNOSIS — Z3009 Encounter for other general counseling and advice on contraception: Secondary | ICD-10-CM | POA: Diagnosis not present

## 2020-01-10 DIAGNOSIS — Z3046 Encounter for surveillance of implantable subdermal contraceptive: Secondary | ICD-10-CM | POA: Diagnosis not present

## 2020-01-10 DIAGNOSIS — N760 Acute vaginitis: Secondary | ICD-10-CM | POA: Diagnosis not present

## 2020-01-10 DIAGNOSIS — Z3481 Encounter for supervision of other normal pregnancy, first trimester: Secondary | ICD-10-CM | POA: Diagnosis not present

## 2020-01-10 DIAGNOSIS — O209 Hemorrhage in early pregnancy, unspecified: Secondary | ICD-10-CM | POA: Diagnosis present

## 2020-01-10 DIAGNOSIS — Z6790 Unspecified blood type, Rh positive: Secondary | ICD-10-CM

## 2020-01-10 LAB — CBC
HCT: 32 % — ABNORMAL LOW (ref 36.0–46.0)
Hemoglobin: 11 g/dL — ABNORMAL LOW (ref 12.0–15.0)
MCH: 30.6 pg (ref 26.0–34.0)
MCHC: 34.4 g/dL (ref 30.0–36.0)
MCV: 88.9 fL (ref 80.0–100.0)
Platelets: 286 10*3/uL (ref 150–400)
RBC: 3.6 MIL/uL — ABNORMAL LOW (ref 3.87–5.11)
RDW: 11.5 % (ref 11.5–15.5)
WBC: 9 10*3/uL (ref 4.0–10.5)
nRBC: 0 % (ref 0.0–0.2)

## 2020-01-10 NOTE — Discharge Instructions (Signed)
Subchorionic Hematoma  A subchorionic hematoma is a gathering of blood between the outer wall of the embryo (chorion) and the inner wall of the womb (uterus). This condition can cause vaginal bleeding. If they cause little or no vaginal bleeding, early small hematomas usually shrink on their own and do not affect your baby or pregnancy. When bleeding starts later in pregnancy, or if the hematoma is larger or occurs in older pregnant women, the condition may be more serious. Larger hematomas may get bigger, which increases the chances of miscarriage. This condition also increases the risk of:  Premature separation of the placenta from the uterus.  Premature (preterm) labor.  Stillbirth. What are the causes? The exact cause of this condition is not known. It occurs when blood is trapped between the placenta and the uterine wall because the placenta has separated from the original site of implantation. What increases the risk? You are more likely to develop this condition if:  You were treated with fertility medicines.  You conceived through in vitro fertilization (IVF). What are the signs or symptoms? Symptoms of this condition include:  Vaginal spotting or bleeding.  Contractions of the uterus. These cause abdominal pain. Sometimes you may have no symptoms and the bleeding may only be seen when ultrasound images are taken (transvaginal ultrasound). How is this diagnosed? This condition is diagnosed based on a physical exam. This includes a pelvic exam. You may also have other tests, including:  Blood tests.  Urine tests.  Ultrasound of the abdomen. How is this treated? Treatment for this condition can vary. Treatment may include:  Watchful waiting. You will be monitored closely for any changes in bleeding. During this stage: ? The hematoma may be reabsorbed by the body. ? The hematoma may separate the fluid-filled space containing the embryo (gestational sac) from the wall of the  womb (endometrium).  Medicines.  Activity restriction. This may be needed until the bleeding stops. Follow these instructions at home:  Stay on bed rest if told to do so by your health care provider.  Do not lift anything that is heavier than 10 lbs. (4.5 kg) or as told by your health care provider.  Do not use any products that contain nicotine or tobacco, such as cigarettes and e-cigarettes. If you need help quitting, ask your health care provider.  Track and write down the number of pads you use each day and how soaked (saturated) they are.  Do not use tampons.  Keep all follow-up visits as told by your health care provider. This is important. Your health care provider may ask you to have follow-up blood tests or ultrasound tests or both. Contact a health care provider if:  You have any vaginal bleeding.  You have a fever. Get help right away if:  You have severe cramps in your stomach, back, abdomen, or pelvis.  You pass large clots or tissue. Save any tissue for your health care provider to look at.  You have more vaginal bleeding, and you faint or become lightheaded or weak. Summary  A subchorionic hematoma is a gathering of blood between the outer wall of the placenta and the uterus.  This condition can cause vaginal bleeding.  Sometimes you may have no symptoms and the bleeding may only be seen when ultrasound images are taken.  Treatment may include watchful waiting, medicines, or activity restriction. This information is not intended to replace advice given to you by your health care provider. Make sure you discuss any questions you   have with your health care provider. Document Revised: 10/31/2017 Document Reviewed: 01/14/2017 Elsevier Patient Education  [place] Medications in Pregnancy    Acne: Benzoyl Peroxide Salicylic Acid  Backache/Headache: Tylenol: 2 regular strength every 4 hours OR              2 Extra  strength every 6 hours  Colds/Coughs/Allergies: Benadryl (alcohol free) 25 mg every 6 hours as needed Breath right strips Claritin Cepacol throat lozenges Chloraseptic throat spray Cold-Eeze- up to three times per day Cough drops, alcohol free Flonase (by prescription only) Guaifenesin Mucinex Robitussin DM (plain only, alcohol free) Saline nasal spray/drops Sudafed (pseudoephedrine) & Actifed ** use only after [redacted] weeks gestation and if you do not have high blood pressure Tylenol Vicks Vaporub Zinc lozenges Zyrtec   Constipation: Colace Ducolax suppositories Fleet enema Glycerin suppositories Metamucil Milk of magnesia Miralax Senokot Smooth move tea  Diarrhea: Kaopectate Imodium A-D  *NO pepto Bismol  Hemorrhoids: Anusol Anusol HC Preparation H Tucks  Indigestion: Tums Maalox Mylanta Zantac  Pepcid  Insomnia: Benadryl (alcohol free) 25mg  every 6 hours as needed Tylenol PM Unisom, no Gelcaps  Leg Cramps: Tums MagGel  Nausea/Vomiting:  Bonine Dramamine Emetrol Ginger extract Sea bands Meclizine  Nausea medication to take during pregnancy:  Unisom (doxylamine succinate 25 mg tablets) Take one tablet daily at bedtime. If symptoms are not adequately controlled, the dose can be increased to a maximum recommended dose of two tablets daily (1/2 tablet in the morning, 1/2 tablet mid-afternoon and one at bedtime). Vitamin B6 100mg  tablets. Take one tablet twice a day (up to 200 mg per day).  Skin Rashes: Aveeno products Benadryl cream or 25mg  every 6 hours as needed Calamine Lotion 1% cortisone cream  Yeast infection: Gyne-lotrimin 7 Monistat 7   **If taking multiple medications, please check labels to avoid duplicating the same active ingredients **take medication as directed on the label ** Do not exceed 4000 mg of tylenol in 24 hours **Do not take medications that contain aspirin or ibuprofen     First Trimester of Pregnancy The  first trimester of pregnancy is from week 1 until the end of week 13 (months 1 through 3). A week after a sperm fertilizes an egg, the egg will implant on the wall of the uterus. This embryo will begin to develop into a baby. Genes from you and your partner will form the baby. The female genes will determine whether the baby will be a boy or a girl. At 6-8 weeks, the eyes and face will be formed, and the heartbeat can be seen on ultrasound. At the end of 12 weeks, all the baby's organs will be formed. Now that you are pregnant, you will want to do everything you can to have a healthy baby. Two of the most important things are to get good prenatal care and to follow your health care provider's instructions. Prenatal care is all the medical care you receive before the baby's birth. This care will help prevent, find, and treat any problems during the pregnancy and childbirth. Body changes during your first trimester Your body goes through many changes during pregnancy. The changes vary from woman to woman.  You may gain or lose a couple of pounds at first.  You may feel sick to your stomach (nauseous) and you may throw up (vomit). If the vomiting is uncontrollable, call your health  care provider.  You may tire easily.  You may develop headaches that can be relieved by medicines. All medicines should be approved by your health care provider.  You may urinate more often. Painful urination may mean you have a bladder infection.  You may develop heartburn as a result of your pregnancy.  You may develop constipation because certain hormones are causing the muscles that push stool through your intestines to slow down.  You may develop hemorrhoids or swollen veins (varicose veins).  Your breasts may begin to grow larger and become tender. Your nipples may stick out more, and the tissue that surrounds them (areola) may become darker.  Your gums may bleed and may be sensitive to brushing and  flossing.  Dark spots or blotches (chloasma, mask of pregnancy) may develop on your face. This will likely fade after the baby is born.  Your menstrual periods will stop.  You may have a loss of appetite.  You may develop cravings for certain kinds of food.  You may have changes in your emotions from day to day, such as being excited to be pregnant or being concerned that something may go wrong with the pregnancy and baby.  You may have more vivid and strange dreams.  You may have changes in your hair. These can include thickening of your hair, rapid growth, and changes in texture. Some women also have hair loss during or after pregnancy, or hair that feels dry or thin. Your hair will most likely return to normal after your baby is born. What to expect at prenatal visits During a routine prenatal visit:  You will be weighed to make sure you and the baby are growing normally.  Your blood pressure will be taken.  Your abdomen will be measured to track your baby's growth.  The fetal heartbeat will be listened to between weeks 10 and 14 of your pregnancy.  Test results from any previous visits will be discussed. Your health care provider may ask you:  How you are feeling.  If you are feeling the baby move.  If you have had any abnormal symptoms, such as leaking fluid, bleeding, severe headaches, or abdominal cramping.  If you are using any tobacco products, including cigarettes, chewing tobacco, and electronic cigarettes.  If you have any questions. Other tests that may be performed during your first trimester include:  Blood tests to find your blood type and to check for the presence of any previous infections. The tests will also be used to check for low iron levels (anemia) and protein on red blood cells (Rh antibodies). Depending on your risk factors, or if you previously had diabetes during pregnancy, you may have tests to check for high blood sugar that affects pregnant  women (gestational diabetes).  Urine tests to check for infections, diabetes, or protein in the urine.  An ultrasound to confirm the proper growth and development of the baby.  Fetal screens for spinal cord problems (spina bifida) and Down syndrome.  HIV (human immunodeficiency virus) testing. Routine prenatal testing includes screening for HIV, unless you choose not to have this test.  You may need other tests to make sure you and the baby are doing well. Follow these instructions at home: Medicines  Follow your health care provider's instructions regarding medicine use. Specific medicines may be either safe or unsafe to take during pregnancy.  Take a prenatal vitamin that contains at least 600 micrograms (mcg) of folic acid.  If you develop constipation, try taking a  stool softener if your health care provider approves. Eating and drinking   Eat a balanced diet that includes fresh fruits and vegetables, whole grains, good sources of protein such as meat, eggs, or tofu, and low-fat dairy. Your health care provider will help you determine the amount of weight gain that is right for you.  Avoid raw meat and uncooked cheese. These carry germs that can cause birth defects in the baby.  Eating four or five small meals rather than three large meals a day may help relieve nausea and vomiting. If you start to feel nauseous, eating a few soda crackers can be helpful. Drinking liquids between meals, instead of during meals, also seems to help ease nausea and vomiting.  Limit foods that are high in fat and processed sugars, such as fried and sweet foods.  To prevent constipation: ? Eat foods that are high in fiber, such as fresh fruits and vegetables, whole grains, and beans. ? Drink enough fluid to keep your urine clear or pale yellow. Activity  Exercise only as directed by your health care provider. Most women can continue their usual exercise routine during pregnancy. Try to exercise for  30 minutes at least 5 days a week. Exercising will help you: ? Control your weight. ? Stay in shape. ? Be prepared for labor and delivery.  Experiencing pain or cramping in the lower abdomen or lower back is a good sign that you should stop exercising. Check with your health care provider before continuing with normal exercises.  Try to avoid standing for long periods of time. Move your legs often if you must stand in one place for a long time.  Avoid heavy lifting.  Wear low-heeled shoes and practice good posture.  You may continue to have sex unless your health care provider tells you not to. Relieving pain and discomfort  Wear a good support bra to relieve breast tenderness.  Take warm sitz baths to soothe any pain or discomfort caused by hemorrhoids. Use hemorrhoid cream if your health care provider approves.  Rest with your legs elevated if you have leg cramps or low back pain.  If you develop varicose veins in your legs, wear support hose. Elevate your feet for 15 minutes, 3-4 times a day. Limit salt in your diet. Prenatal care  Schedule your prenatal visits by the twelfth week of pregnancy. They are usually scheduled monthly at first, then more often in the last 2 months before delivery.  Write down your questions. Take them to your prenatal visits.  Keep all your prenatal visits as told by your health care provider. This is important. Safety  Wear your seat belt at all times when driving.  Make a list of emergency phone numbers, including numbers for family, friends, the hospital, and police and fire departments. General instructions  Ask your health care provider for a referral to a local prenatal education class. Begin classes no later than the beginning of month 6 of your pregnancy.  Ask for help if you have counseling or nutritional needs during pregnancy. Your health care provider can offer advice or refer you to specialists for help with various needs.  Do not  use hot tubs, steam rooms, or saunas.  Do not douche or use tampons or scented sanitary pads.  Do not cross your legs for long periods of time.  Avoid cat litter boxes and soil used by cats. These carry germs that can cause birth defects in the baby and possibly loss of the fetus  by miscarriage or stillbirth.  Avoid all smoking, herbs, alcohol, and medicines not prescribed by your health care provider. Chemicals in these products affect the formation and growth of the baby.  Do not use any products that contain nicotine or tobacco, such as cigarettes and e-cigarettes. If you need help quitting, ask your health care provider. You may receive counseling support and other resources to help you quit.  Schedule a dentist appointment. At home, brush your teeth with a soft toothbrush and be gentle when you floss. Contact a health care provider if:  You have dizziness.  You have mild pelvic cramps, pelvic pressure, or nagging pain in the abdominal area.  You have persistent nausea, vomiting, or diarrhea.  You have a bad smelling vaginal discharge.  You have pain when you urinate.  You notice increased swelling in your face, hands, legs, or ankles.  You are exposed to fifth disease or chickenpox.  You are exposed to [place] measles (rubella) and have never had it. Get help right away if:  You have a fever.  You are leaking fluid from your vagina.  You have spotting or bleeding from your vagina.  You have severe abdominal cramping or pain.  You have rapid weight gain or loss.  You vomit blood or material that looks like coffee grounds.  You develop a severe headache.  You have shortness of breath.  You have any kind of trauma, such as from a fall or a car accident. Summary  The first trimester of pregnancy is from week 1 until the end of week 13 (months 1 through 3).  Your body goes through many changes during pregnancy. The changes vary from woman to woman.  You will have  routine prenatal visits. During those visits, your health care provider will examine you, discuss any test results you may have, and talk with you about how you are feeling. This information is not intended to replace advice given to you by your health care provider. Make sure you discuss any questions you have with your health care provider. Document Revised: 10/31/2017 Document Reviewed: 10/30/2016 Elsevier Patient Education  2020 [employer]  Vaginal Bleeding During Pregnancy, First Trimester  A small amount of bleeding from the vagina (spotting) is relatively common during early pregnancy. It usually stops on its own. Various things may cause bleeding or spotting during early pregnancy. Some bleeding may be related to the pregnancy, and some may not. In many cases, the bleeding is normal and is not a problem. However, bleeding can also be a sign of something serious. Be sure to tell your health care provider about any vaginal bleeding right away. Some possible causes of vaginal bleeding during the first trimester include:  Infection or inflammation of the cervix.  Growths (polyps) on the cervix.  Miscarriage or threatened miscarriage.  Pregnancy tissue developing outside of the uterus (ectopic pregnancy).  A mass of tissue developing in the uterus due to an egg being fertilized incorrectly (molar pregnancy). Follow these instructions at home: Activity  Follow instructions from your health care provider about limiting your activity. Ask what activities are safe for you.  If needed, make plans for someone to help with your regular activities.  Do not have sex or orgasms until your health care provider says that this is safe. General instructions  Take over-the-counter and prescription medicines only as told by your health care provider.  Pay attention to any changes in your symptoms.  Do not use tampons or douche.  Write down  how many pads you use each day, how often you  change pads, and how soaked (saturated) they are.  If you pass any tissue from your vagina, save the tissue so you can show it to your health care provider.  Keep all follow-up visits as told by your health care provider. This is important. Contact a health care provider if:  You have vaginal bleeding during any part of your pregnancy.  You have cramps or labor pains.  You have a fever. Get help right away if:  You have severe cramps in your back or abdomen.  You pass large clots or a large amount of tissue from your vagina.  Your bleeding increases.  You feel light-headed or weak, or you faint.  You have chills.  You are leaking fluid or have a gush of fluid from your vagina. Summary  A small amount of bleeding (spotting) from the vagina is relatively common during early pregnancy.  Various things may cause bleeding or spotting in early pregnancy.  Be sure to tell your health care provider about any vaginal bleeding right away. This information is not intended to replace advice given to you by your health care provider. Make sure you discuss any questions you have with your health care provider. Document Revised: 03/09/2019 Document Reviewed: 02/20/2017 Elsevier Patient Education  2020 [employer].

## 2020-01-10 NOTE — [facility] Note (Signed)
Pt is a G2P0 at 10.4 weeks sent from her first OB visit for bleeding and passing of clot while in the office.  Pt was told that she may have had a miscarriage and to come here.  Pt reports no bleeding now or before, no cramping, and no sex in the last 2 days.    Pt was told from an [place] in [facility] that she had an subchorionic hemorrhage and has had some bleeding off and on the last month.     Pt reports no other medical problems.

## 2020-01-10 NOTE — MAU Provider Note (Signed)
History     CSN: [id number]  Arrival date and time: 01/10/20 1436   First Provider Initiated Contact with Patient 01/10/20 1545      Chief Complaint  Patient presents with  . Vaginal Bleeding   [name] is a 19 y.o. G2P0010 at [redacted]w[redacted]d who presents to MAU for vaginal bleeding which began this morning. [facility] and was using the restroom when a single blood clot came out. Pt reports she has only had some light pink spotting prior and did not have any bleeding after.  Passing blood clots? per above Blood soaking clothes? no Lightheaded/dizzy? no Significant pelvic pain or cramping? no Passed any tissue? unsure  Current pregnancy problems? pt was to be evaluated at NOB visit, but was sent to MAU after blood clot Blood Type? O Positive Allergies? NKDA Current medications? PNVs Current [facility] & next appt? [facility], unscheduled  Pt denies vaginal discharge/odor/itching. Pt denies N/V, abdominal pain, constipation, diarrhea, or urinary problems. Pt denies fever, chills, fatigue, sweating or changes in appetite. Pt denies SOB or chest pain. Pt denies dizziness, HA, light-headedness, weakness.   OB History    Gravida  2   Para  0   Term      Preterm      AB  1   Living  0     SAB  1   TAB      Ectopic      Multiple      Live Births              Past Medical History:  Diagnosis Date  . Medical history non-contributory     Past Surgical History:  Procedure Laterality Date  . NO PAST SURGERIES      Family History  Family history unknown: Yes    Social History   Tobacco Use  . Smoking status: Former Smoker    Types: Cigars  . Smokeless tobacco: Never Used  Substance Use Topics  . Alcohol use: Never  . Drug use: Not Currently    Types: Marijuana    Allergies: No Known Allergies  Medications Prior to Admission  Medication Sig Dispense Refill Last Dose  . prenatal vitamin w/FE, FA (NATACHEW) 29-1  MG CHEW chewable tablet Chew 1 tablet by mouth daily at 12 noon.       Review of Systems  Constitutional: Negative for chills, diaphoresis, fatigue and fever.  Eyes: Negative for visual disturbance.  Respiratory: Negative for shortness of breath.   Cardiovascular: Negative for chest pain.  Gastrointestinal: Negative for abdominal pain, constipation, diarrhea, nausea and vomiting.  Genitourinary: Positive for vaginal bleeding. Negative for dysuria, flank pain, frequency, pelvic pain, urgency and vaginal discharge.  Neurological: Negative for dizziness, weakness, light-headedness and headaches.   Physical Exam   Blood pressure 118/63, pulse 72, temperature 98 F (36.7 C), temperature source Oral, resp. rate 17, last menstrual period 08/30/2019, SpO2 100 %.  Patient Vitals for the past 24 hrs:  BP Temp Temp src Pulse Resp SpO2  01/10/20 1516 -- 98 F (36.7 C) Oral -- 17 --  01/10/20 1511 118/63 -- -- 72 -- 100 %   Physical Exam  Constitutional: She is oriented to person, place, and time. She appears well-developed and well-nourished. No distress.  HENT:  Head: Normocephalic and atraumatic.  Respiratory: Effort normal.  GI: Soft. She exhibits no distension and no mass. There is no abdominal tenderness. There is no rebound and no guarding.  Genitourinary:    Genitourinary Comments: CE: long/closed/posterior   Neurological: She is alert and oriented to person, place, and time.  Skin: Skin is warm and dry. She is not diaphoretic.  Psychiatric: She has a normal mood and affect. Her behavior is normal. Judgment and thought content normal.   Results for orders placed or performed during the hospital encounter of 01/10/20 (from the past 24 hour(s))  CBC     Status: Abnormal   Collection Time: 01/10/20  5:17 PM  Result Value Ref Range   WBC 9.0 4.0 - 10.5 K/uL   RBC 3.60 (L) 3.87 - 5.11 MIL/uL   Hemoglobin 11.0 (L) 12.0 - 15.0 g/dL   HCT 32.0 (L) 36.0 - 46.0 %   MCV 88.9 80.0 - 100.0  fL   MCH 30.6 26.0 - 34.0 pg   MCHC 34.4 30.0 - 36.0 g/dL   RDW 11.5 11.5 - 15.5 %   Platelets 286 150 - 400 K/uL   nRBC 0.0 0.0 - 0.2 %   US OB Comp Less 14 Wks  Result Date: 01/10/2020 CLINICAL DATA:  Pregnant, passed blood clot today EXAM: OBSTETRIC <14 WK ULTRASOUND TECHNIQUE: Transabdominal ultrasound was performed for evaluation of the gestation as well as the maternal uterus and adnexal regions. COMPARISON:  12/17/2019 FINDINGS: Intrauterine gestational sac: Single Yolk sac:  Not Visualized. Embryo:  Visualized. Cardiac Activity: Visualized. Heart Rate: 185 bpm CRL:   41 mm   10 w 6 d                  [place] EDC: 08/01/2020 Subchorionic hemorrhage: There is a small subchorionic hemorrhage posteriorly measuring 0.8 x 2.6 x 0.8 cm. Maternal uterus/adnexae: Ovaries are not visualized. Maternal uterus is grossly unremarkable. IMPRESSION: 1. Single live intrauterine pregnancy as above estimated age 21 weeks and 6 days. 2. Small subchorionic hemorrhage, new since prior study. The echogenic material previously seen within the lower endometrial canal is no longer identified. Electronically Signed   By: Randa Ngo M.D.   On: 01/10/2020 16:47   US OB Transvaginal  Result Date: 12/17/2019 CLINICAL DATA:  Pregnant patient in first-trimester pregnancy with vaginal bleeding. EXAM: TRANSVAGINAL OB ULTRASOUND TECHNIQUE: Transvaginal ultrasound was performed for complete evaluation of the gestation as well as the maternal uterus, adnexal regions, and pelvic cul-de-sac. COMPARISON:  Obstetric ultrasound 3 days ago 12/13/2018, additional priors. FINDINGS: Intrauterine gestational sac: Single Yolk sac:  Visualized. Embryo:  Visualized. Cardiac Activity: Visualized. Heart Rate: 150 bpm CRL:   11.32 mm   7 w 1 d                  [place] EDC: 08/03/2020 Subchorionic hemorrhage: Small, with additional heterogeneous material in the endometrium consistent with blood products/clot. Maternal uterus/adnexae: New heterogeneous  material in the lower endometrial canal consistent with blood products/clot. Right ovary appears normal and contains a corpus luteal cyst. The left ovary is normal. Trace free fluid in the pelvis. IMPRESSION: 1. Single live intrauterine pregnancy estimated gestational age [redacted] weeks 1 day based on crown-rump length for ultrasound [facility] 08/03/2020. 2. Small subchorionic hemorrhage with additional echogenic material in the lower endometrial canal consistent with clot. This is new from exam 3 days ago. Electronically Signed   By: Keith Rake M.D.   On: 12/17/2019 20:02   US OB Transvaginal  Result Date: 12/14/2019 CLINICAL DATA:  Bleeding EXAM: TRANSVAGINAL OB ULTRASOUND TECHNIQUE: Transvaginal ultrasound was performed for complete evaluation of the gestation as well as the maternal uterus, adnexal  regions, and pelvic cul-de-sac. COMPARISON:  12/09/2019 FINDINGS: Intrauterine gestational sac: Single Yolk sac:  Visualized Embryo:  Visualized Cardiac Activity: Visualized Heart Rate: 148 bpm MSD:   mm    w     d CRL:   10.2 mm   7 w 1 d                  [place] EDC: 07/31/2020 Subchorionic hemorrhage:  None visualized. Maternal uterus/adnexae: Right corpus luteal cyst. No adnexal mass or abnormal free fluid. IMPRESSION: Seven week 1 day intrauterine pregnancy based on crown-rump length. Fetal heart rate 148 beats per minute. No acute maternal findings. Electronically Signed   By: Charlett Nose M.D.   On: 12/14/2019 20:56   MAU Course  Procedures  MDM -VB in setting of known IUP -CBC: WNL -[place]: single IUP FHR 185, small SCH -CE: long/closed/posterior -ABO: O Positive -WetPrep performed 12/25/2019 - normal -GC/CT performed 12/09/2019 - normal, pt denies any new partners since this time -pt discharged to home in stable condition   Orders Placed This Encounter  Procedures  . US OB Comp Less 14 Wks    Standing Status:   Standing    Number of Occurrences:   1    Order Specific Question:   Symptom/Reason for  Exam    Answer:   Vaginal bleeding in pregnancy [[id number]]  . CBC    Standing Status:   Standing    Number of Occurrences:   1  . Discharge patient    Order Specific Question:   Discharge disposition    Answer:   01-Home or Self Care [1]    Order Specific Question:   Discharge patient date    Answer:   01/10/2020   No orders of the defined types were placed in this encounter.  Assessment and Plan   1. Subchorionic hemorrhage of placenta in first trimester, single or unspecified fetus   2. Vaginal bleeding in pregnancy   3. Blood type, Rh positive     Allergies as of 01/10/2020   No Known Allergies     Medication List    TAKE these medications   prenatal vitamin w/FE, FA 29-1 MG Chew chewable tablet Chew 1 tablet by mouth daily at 12 noon.      -discussed normal s/sx/expectations with [facility] -message sent to [facility] to schedule NOB visit in next week, per pt request to switch from [facility] to [facility] -pt advised to call [facility] office on Thursday, if she has not heard from them sooner -return MAU precautions given -pt discharged to home in stable condition  Joni Reining E Merci Walthers 01/10/2020, 6:09 PM

## 2020-01-18 ENCOUNTER — Other Ambulatory Visit: Payer: Self-pay

## 2020-01-18 ENCOUNTER — Ambulatory Visit (INDEPENDENT_AMBULATORY_CARE_PROVIDER_SITE_OTHER): Payer: Medicaid Other | Admitting: Advanced Practice Midwife

## 2020-01-18 ENCOUNTER — Encounter: Payer: Self-pay | Admitting: Advanced Practice Midwife

## 2020-01-18 ENCOUNTER — Other Ambulatory Visit (HOSPITAL_COMMUNITY)
Admission: RE | Admit: 2020-01-18 | Discharge: 2020-01-18 | Disposition: A | Discharge status: Home / Self Care | Payer: Medicaid Other | Source: Ambulatory Visit | Attending: Advanced Practice Midwife | Admitting: Advanced Practice Midwife

## 2020-01-18 VITALS — BP 118/70 | HR 78 | Wt 144.6 lb

## 2020-01-18 DIAGNOSIS — Z3490 Encounter for supervision of normal pregnancy, unspecified, unspecified trimester: Secondary | ICD-10-CM | POA: Insufficient documentation

## 2020-01-18 DIAGNOSIS — O418X10 Other specified disorders of amniotic fluid and membranes, first trimester, not applicable or unspecified: Secondary | ICD-10-CM

## 2020-01-18 DIAGNOSIS — Z3480 Encounter for supervision of other normal pregnancy, unspecified trimester: Secondary | ICD-10-CM | POA: Diagnosis not present

## 2020-01-18 DIAGNOSIS — Z3A11 11 weeks gestation of pregnancy: Secondary | ICD-10-CM

## 2020-01-18 DIAGNOSIS — O468X1 Other antepartum hemorrhage, first trimester: Secondary | ICD-10-CM | POA: Insufficient documentation

## 2020-01-18 DIAGNOSIS — Z3181 Encounter for male factor infertility in female patient: Secondary | ICD-10-CM

## 2020-01-18 MED ORDER — BLOOD PRESSURE CUFF MISC: 1.0000 | 0 refills | Status: DC

## 2020-01-18 MED ORDER — ASPIRIN EC 81 MG PO TBEC: 81.0000 mg | DELAYED_RELEASE_TABLET | Freq: Every day | ORAL | 5 refills | Status: DC

## 2020-01-18 NOTE — Progress Notes (Signed)
Pt is here for initial OB visit. [place] on 12/09/19 confirmed EDD of 08/03/20

## 2020-01-18 NOTE — Patient Instructions (Signed)
First Trimester of Pregnancy The first trimester of pregnancy is from week 1 until the end of week 13 (months 1 through 3). A week after a sperm fertilizes an egg, the egg will implant on the wall of the uterus. This embryo will begin to develop into a baby. Genes from you and your partner will form the baby. The female genes will determine whether the baby will be a boy or a girl. At 6-8 weeks, the eyes and face will be formed, and the heartbeat can be seen on ultrasound. At the end of 12 weeks, all the baby's organs will be formed. Now that you are pregnant, you will want to do everything you can to have a healthy baby. Two of the most important things are to get good prenatal care and to follow your health care provider's instructions. Prenatal care is all the medical care you receive before the baby's birth. This care will help prevent, find, and treat any problems during the pregnancy and childbirth. Body changes during your first trimester Your body goes through many changes during pregnancy. The changes vary from woman to woman.  You may gain or lose a couple of pounds at first.  You may feel sick to your stomach (nauseous) and you may throw up (vomit). If the vomiting is uncontrollable, call your health care provider.  You may tire easily.  You may develop headaches that can be relieved by medicines. All medicines should be approved by your health care provider.  You may urinate more often. Painful urination may mean you have a bladder infection.  You may develop heartburn as a result of your pregnancy.  You may develop constipation because certain hormones are causing the muscles that push stool through your intestines to slow down.  You may develop hemorrhoids or swollen veins (varicose veins).  Your breasts may begin to grow larger and become tender. Your nipples may stick out more, and the tissue that surrounds them (areola) may become darker.  Your gums may bleed and may be  sensitive to brushing and flossing.  Dark spots or blotches (chloasma, mask of pregnancy) may develop on your face. This will likely fade after the baby is born.  Your menstrual periods will stop.  You may have a loss of appetite.  You may develop cravings for certain kinds of food.  You may have changes in your emotions from day to day, such as being excited to be pregnant or being concerned that something may go wrong with the pregnancy and baby.  You may have more vivid and strange dreams.  You may have changes in your hair. These can include thickening of your hair, rapid growth, and changes in texture. Some women also have hair loss during or after pregnancy, or hair that feels dry or thin. Your hair will most likely return to normal after your baby is born. What to expect at prenatal visits During a routine prenatal visit:  You will be weighed to make sure you and the baby are growing normally.  Your blood pressure will be taken.  Your abdomen will be measured to track your baby's growth.  The fetal heartbeat will be listened to between weeks 10 and 14 of your pregnancy.  Test results from any previous visits will be discussed. Your health care provider may ask you:  How you are feeling.  If you are feeling the baby move.  If you have had any abnormal symptoms, such as leaking fluid, bleeding, severe headaches, or abdominal   cramping.  If you are using any tobacco products, including cigarettes, chewing tobacco, and electronic cigarettes.  If you have any questions. Other tests that may be performed during your first trimester include:  Blood tests to find your blood type and to check for the presence of any previous infections. The tests will also be used to check for low iron levels (anemia) and protein on red blood cells (Rh antibodies). Depending on your risk factors, or if you previously had diabetes during pregnancy, you may have tests to check for high blood sugar  that affects pregnant women (gestational diabetes).  Urine tests to check for infections, diabetes, or protein in the urine.  An ultrasound to confirm the proper growth and development of the baby.  Fetal screens for spinal cord problems (spina bifida) and Down syndrome.  HIV (human immunodeficiency virus) testing. Routine prenatal testing includes screening for HIV, unless you choose not to have this test.  You may need other tests to make sure you and the baby are doing well. Follow these instructions at home: Medicines  Follow your health care provider's instructions regarding medicine use. Specific medicines may be either safe or unsafe to take during pregnancy.  Take a prenatal vitamin that contains at least 600 micrograms (mcg) of folic acid.  If you develop constipation, try taking a stool softener if your health care provider approves. Eating and drinking   Eat a balanced diet that includes fresh fruits and vegetables, whole grains, good sources of protein such as meat, eggs, or tofu, and low-fat dairy. Your health care provider will help you determine the amount of weight gain that is right for you.  Avoid raw meat and uncooked cheese. These carry germs that can cause birth defects in the baby.  Eating four or five small meals rather than three large meals a day may help relieve nausea and vomiting. If you start to feel nauseous, eating a few soda crackers can be helpful. Drinking liquids between meals, instead of during meals, also seems to help ease nausea and vomiting.  Limit foods that are high in fat and processed sugars, such as fried and sweet foods.  To prevent constipation: ? Eat foods that are high in fiber, such as fresh fruits and vegetables, whole grains, and beans. ? Drink enough fluid to keep your urine clear or pale yellow. Activity  Exercise only as directed by your health care provider. Most women can continue their usual exercise routine during  pregnancy. Try to exercise for 30 minutes at least 5 days a week. Exercising will help you: ? Control your weight. ? Stay in shape. ? Be prepared for labor and delivery.  Experiencing pain or cramping in the lower abdomen or lower back is a good sign that you should stop exercising. Check with your health care provider before continuing with normal exercises.  Try to avoid standing for long periods of time. Move your legs often if you must stand in one place for a long time.  Avoid heavy lifting.  Wear low-heeled shoes and practice good posture.  You may continue to have sex unless your health care provider tells you not to. Relieving pain and discomfort  Wear a good support bra to relieve breast tenderness.  Take warm sitz baths to soothe any pain or discomfort caused by hemorrhoids. Use hemorrhoid cream if your health care provider approves.  Rest with your legs elevated if you have leg cramps or low back pain.  If you develop varicose veins in   your legs, wear support hose. Elevate your feet for 15 minutes, 3-4 times a day. Limit salt in your diet. Prenatal care  Schedule your prenatal visits by the twelfth week of pregnancy. They are usually scheduled monthly at first, then more often in the last 2 months before delivery.  Write down your questions. Take them to your prenatal visits.  Keep all your prenatal visits as told by your health care provider. This is important. Safety  Wear your seat belt at all times when driving.  Make a list of emergency phone numbers, including numbers for family, friends, the hospital, and police and fire departments. General instructions  Ask your health care provider for a referral to a local prenatal education class. Begin classes no later than the beginning of month 6 of your pregnancy.  Ask for help if you have counseling or nutritional needs during pregnancy. Your health care provider can offer advice or refer you to specialists for help  with various needs.  Do not use hot tubs, steam rooms, or saunas.  Do not douche or use tampons or scented sanitary pads.  Do not cross your legs for long periods of time.  Avoid cat litter boxes and soil used by cats. These carry germs that can cause birth defects in the baby and possibly loss of the fetus by miscarriage or stillbirth.  Avoid all smoking, herbs, alcohol, and medicines not prescribed by your health care provider. Chemicals in these products affect the formation and growth of the baby.  Do not use any products that contain nicotine or tobacco, such as cigarettes and e-cigarettes. If you need help quitting, ask your health care provider. You may receive counseling support and other resources to help you quit.  Schedule a dentist appointment. At home, brush your teeth with a soft toothbrush and be gentle when you floss. Contact a health care provider if:  You have dizziness.  You have mild pelvic cramps, pelvic pressure, or nagging pain in the abdominal area.  You have persistent nausea, vomiting, or diarrhea.  You have a bad smelling vaginal discharge.  You have pain when you urinate.  You notice increased swelling in your face, hands, legs, or ankles.  You are exposed to fifth disease or chickenpox.  You are exposed to German measles (rubella) and have never had it. Get help right away if:  You have a fever.  You are leaking fluid from your vagina.  You have spotting or bleeding from your vagina.  You have severe abdominal cramping or pain.  You have rapid weight gain or loss.  You vomit blood or material that looks like coffee grounds.  You develop a severe headache.  You have shortness of breath.  You have any kind of trauma, such as from a fall or a car accident. Summary  The first trimester of pregnancy is from week 1 until the end of week 13 (months 1 through 3).  Your body goes through many changes during pregnancy. The changes vary from  woman to woman.  You will have routine prenatal visits. During those visits, your health care provider will examine you, discuss any test results you may have, and talk with you about how you are feeling. This information is not intended to replace advice given to you by your health care provider. Make sure you discuss any questions you have with your health care provider. Document Revised: 10/31/2017 Document Reviewed: 10/30/2016 [employer]vier Patient Education  2020 [employer]  

## 2020-01-18 NOTE — Progress Notes (Signed)
Subjective:   Jamaria Amborn is a 19 y.o. G2P0010 at [redacted]w[redacted]d by LMP, early ultrasound being seen today for her first obstetrical visit.  Her obstetrical history is significant for subchorionic hemorrhage with light intermittent bleeding in this pregnancy and has Encounter for supervision of normal pregnancy, antepartum on their problem list.. Patient does intend to breast feed. Pregnancy history fully reviewed.  Patient reports no complaints.  HISTORY: OB History  Gravida Para Term Preterm AB Living  2 0 0 0 1 0  SAB TAB Ectopic Multiple Live Births  1 0 0 0 0    # Outcome Date GA Lbr Len/2nd Weight Sex Delivery Anes PTL Lv  2 Current           1 SAB 10/2019 [redacted]w[redacted]d          Past Medical History:  Diagnosis Date  . Medical history non-contributory    Past Surgical History:  Procedure Laterality Date  . NO PAST SURGERIES     Family History  Problem Relation Age of Onset  . Asthma Father   . Asthma Sister    Social History   Tobacco Use  . Smoking status: Former Smoker    Types: Cigars  . Smokeless tobacco: Never Used  Substance Use Topics  . Alcohol use: Never  . Drug use: Not Currently    Types: Marijuana   No Known Allergies Current Outpatient Medications on File Prior to Visit  Medication Sig Dispense Refill  . prenatal vitamin w/FE, FA (NATACHEW) 29-1 MG CHEW chewable tablet Chew 1 tablet by mouth daily at 12 noon.     No current facility-administered medications on file prior to visit.     Indications for ASA therapy (per uptodate) One of the following: Previous pregnancy with preeclampsia, especially early onset and with an adverse outcome No Multifetal gestation No Chronic hypertension No Type 1 or 2 diabetes mellitus No Chronic kidney disease No Autoimmune disease (antiphospholipid syndrome, systemic lupus erythematosus) No   Two or more of the following: Nulliparity Yes Obesity (body mass index >30 kg/m2) No Family history of preeclampsia in  mother or sister Yes Age ?35 years No Sociodemographic characteristics (African American race, low socioeconomic level) Yes Personal risk factors (eg, previous pregnancy with low birth weight or small for gestational age infant, previous adverse pregnancy outcome [eg, stillbirth], interval >10 years between pregnancies) No   Indications for early 1 hour GTT (per uptodate)  BMI >25 (>23 in Asian women) AND one of the following  Pt prepregnancy BMI is 21.92  Gestational diabetes mellitus in a previous pregnancy No Glycated hemoglobin ?5.7 percent (39 mmol/mol), impaired glucose tolerance, or impaired fasting glucose on previous testing No First-degree relative with diabetes No High-risk race/ethnicity (eg, African American, Latino, Native American, [place] American, Pacific Islander) Yes History of cardiovascular disease No Hypertension or on therapy for hypertension No High-density lipoprotein cholesterol level <35 mg/dL (4.00 mmol/L) and/or a triglyceride level >250 mg/dL (8.67 mmol/L) No Polycystic ovary syndrome No Physical inactivity No Other clinical condition associated with insulin resistance (eg, severe obesity, acanthosis nigricans) No Previous birth of an infant weighing ?4000 g No Previous stillbirth of unknown cause No Exam   Vitals:   01/18/20 1348  BP: 118/70  Pulse: 78  Weight: 144 lb 9.6 oz (65.6 kg)   Fetal Heart Rate (bpm): seen on mobile US  Uterus:     Pelvic Exam: Perineum: no hemorrhoids, normal perineum   Vulva: normal external genitalia, no lesions   Vagina:  normal mucosa, normal discharge   Cervix: no lesions and normal, pap smear done.    Adnexa: normal adnexa and no mass, fullness, tenderness   Bony Pelvis: average  System: General: well-developed, well-nourished female in no acute distress   Breast:  normal appearance, no masses or tenderness   Skin: normal coloration and turgor, no rashes   Neurologic: oriented, normal, negative, normal mood    Extremities: normal strength, tone, and muscle mass, ROM of all joints is normal   HEENT PERRLA, extraocular movement intact and sclera clear, anicteric   Mouth/Teeth mucous membranes moist, pharynx normal without lesions and dental hygiene good   Neck supple and no masses   Cardiovascular: regular rate and rhythm   Respiratory:  no respiratory distress, normal breath sounds   Abdomen: soft, non-tender; bowel sounds normal; no masses,  no organomegaly     Assessment:   Pregnancy: G2P0010 Patient Active Problem List   Diagnosis Date Noted  . Encounter for supervision of normal pregnancy, antepartum 01/18/2020     Plan:  1. Supervision of other normal pregnancy, antepartum --Anticipatory guidance about next visits/weeks of pregnancy given.  - Obstetric Panel, Including HIV - Genetic Screening - Culture, OB Urine - Enroll Patient in Babyscripts - [place] MFM OB COMP + 14 WK; Future - aspirin EC 81 MG tablet; Take 1 tablet (81 mg total) by mouth daily.  Dispense: 30 tablet; Refill: 5  2. Subchorionic hemorrhage of placenta in first trimester, single or unspecified fetus --Pt with intermittent vaginal bleeding, brown/pink noted on today's swab. --Infection testing today but bleeding c/w known [facility] --Bedside US today since unable to hear FHT with doppler, FHR 150s, normal fluid, fetal movement, no visible hemorrhage. - Cervicovaginal ancillary only( [place])  Initial labs drawn. Continue prenatal vitamins. Discussed and offered genetic screening options, including Quad screen/AFP, NIPS testing, and option to decline testing. Benefits/risks/alternatives reviewed. Pt aware that anatomy US is form of genetic screening with lower accuracy in detecting trisomies than blood work.  Pt chooses genetic screening today. NIPS: requested. Ultrasound discussed; fetal anatomic survey: requested. Problem list reviewed and updated. The nature of [place] with  multiple MDs and other Advanced Practice Providers was explained to patient; also emphasized that residents, students are part of our team. Routine obstetric precautions reviewed. Return in about 4 weeks (around 02/15/2020).   [name], CNM 01/18/20 4:20 PM

## 2020-01-19 DIAGNOSIS — Z3400 Encounter for supervision of normal first pregnancy, unspecified trimester: Secondary | ICD-10-CM | POA: Diagnosis not present

## 2020-01-19 LAB — OBSTETRIC PANEL, INCLUDING HIV
Antibody Screen: NEGATIVE
Basophils Absolute: 0 10*3/uL (ref 0.0–0.2)
Basos: 0 %
EOS (ABSOLUTE): 0.1 10*3/uL (ref 0.0–0.4)
Eos: 2 %
HIV Screen 4th Generation wRfx: NONREACTIVE
Hematocrit: 33.6 % — ABNORMAL LOW (ref 34.0–46.6)
Hemoglobin: 11.3 g/dL (ref 11.1–15.9)
Hepatitis B Surface Ag: NEGATIVE
Immature Grans (Abs): 0 10*3/uL (ref 0.0–0.1)
Immature Granulocytes: 0 %
Lymphocytes Absolute: 1.9 10*3/uL (ref 0.7–3.1)
Lymphs: 21 %
MCH: 31 pg (ref 26.6–33.0)
MCHC: 33.6 g/dL (ref 31.5–35.7)
MCV: 92 fL (ref 79–97)
Monocytes Absolute: 0.5 10*3/uL (ref 0.1–0.9)
Monocytes: 6 %
Neutrophils Absolute: 6.5 10*3/uL (ref 1.4–7.0)
Neutrophils: 71 %
Platelets: 305 10*3/uL (ref 150–450)
RBC: 3.65 x10E6/uL — ABNORMAL LOW (ref 3.77–5.28)
RDW: 11.4 % — ABNORMAL LOW (ref 11.7–15.4)
RPR Ser Ql: NONREACTIVE
Rh Factor: POSITIVE
Rubella Antibodies, IGG: 5.72 index (ref >0.99)
WBC: 9.1 10*3/uL (ref 3.4–10.8)

## 2020-01-19 LAB — CERVICOVAGINAL ANCILLARY ONLY
Chlamydia: NEGATIVE
Comment: NEGATIVE
Comment: NEGATIVE
Comment: NORMAL
Neisseria Gonorrhea: NEGATIVE
Trichomonas: NEGATIVE

## 2020-01-20 ENCOUNTER — Inpatient Hospital Stay (HOSPITAL_COMMUNITY)
Admission: AD | Admit: 2020-01-20 | Discharge: 2020-01-20 | Disposition: A | Discharge status: Home / Self Care | Payer: Medicaid Other | Source: Ambulatory Visit | Attending: Obstetrics and Gynecology | Admitting: Obstetrics and Gynecology

## 2020-01-20 ENCOUNTER — Other Ambulatory Visit: Payer: Self-pay

## 2020-01-20 ENCOUNTER — Encounter (HOSPITAL_COMMUNITY): Payer: Self-pay | Admitting: Obstetrics and Gynecology

## 2020-01-20 DIAGNOSIS — O418X10 Other specified disorders of amniotic fluid and membranes, first trimester, not applicable or unspecified: Secondary | ICD-10-CM

## 2020-01-20 DIAGNOSIS — Z87891 Personal history of nicotine dependence: Secondary | ICD-10-CM | POA: Diagnosis not present

## 2020-01-20 DIAGNOSIS — Z7982 Long term (current) use of aspirin: Secondary | ICD-10-CM | POA: Insufficient documentation

## 2020-01-20 DIAGNOSIS — Z3A12 12 weeks gestation of pregnancy: Secondary | ICD-10-CM | POA: Diagnosis not present

## 2020-01-20 DIAGNOSIS — O468X1 Other antepartum hemorrhage, first trimester: Secondary | ICD-10-CM | POA: Insufficient documentation

## 2020-01-20 DIAGNOSIS — O4692 Antepartum hemorrhage, unspecified, second trimester: Secondary | ICD-10-CM

## 2020-01-20 LAB — CULTURE, OB URINE

## 2020-01-20 LAB — URINE CULTURE, OB REFLEX

## 2020-01-20 NOTE — MAU Provider Note (Signed)
Chief Complaint: Vaginal Bleeding   First Provider Initiated Contact with Patient 01/20/20 0606        SUBJECTIVE HPI: Alicia Black is a 19 y.o. G2P0010 at [redacted]w[redacted]d by LMP who presents to maternity admissions reporting bleeding and passage of large clot.  Was told she had a subchorionic hematoma (reported to be small), worried this is a miscarriage. . She denies vaginal itching/burning, urinary symptoms, h/a, dizziness, n/v, or fever/chills.    Vaginal Bleeding The patient's primary symptoms include vaginal bleeding. The patient's pertinent negatives include no genital itching, genital lesions, genital odor or pelvic pain. This is a recurrent problem. The current episode started today. The problem occurs intermittently. The problem has been unchanged. She is pregnant. Pertinent negatives include no abdominal pain, back pain, diarrhea, dysuria or fever. The vaginal discharge was bloody. The vaginal bleeding is lighter than menses. She has been passing clots. She has not been passing tissue. Nothing aggravates the symptoms. She has tried nothing for the symptoms.    RN Note: Got up to BR about an hour ago and having bright vag bleeding. No pain.   Past Medical History:  Diagnosis Date  . Medical history non-contributory    Past Surgical History:  Procedure Laterality Date  . NO PAST SURGERIES     Social History   Socioeconomic History  . Marital status: Single    Spouse name: Not on file  . Number of children: Not on file  . Years of education: Not on file  . Highest education level: Not on file  Occupational History  . Occupation: housekeeping  Tobacco Use  . Smoking status: Former Smoker    Types: Cigars  . Smokeless tobacco: Never Used  Substance and Sexual Activity  . Alcohol use: Never  . Drug use: Not Currently    Types: Marijuana  . Sexual activity: Yes  Other Topics Concern  . Not on file  Social History Narrative  . Not on file   Social Determinants of Health    Financial Resource Strain:   . Difficulty of Paying Living Expenses: Not on file  Food Insecurity:   . Worried About Programme researcher, broadcasting/film/video in the Last Year: Not on file  . Ran Out of Food in the Last Year: Not on file  Transportation Needs:   . Lack of Transportation (Medical): Not on file  . Lack of Transportation (Non-Medical): Not on file  Physical Activity:   . Days of Exercise per Week: Not on file  . Minutes of Exercise per Session: Not on file  Stress:   . Feeling of Stress : Not on file  Social Connections:   . Frequency of Communication with Friends and Family: Not on file  . Frequency of Social Gatherings with Friends and Family: Not on file  . Attends Religious Services: Not on file  . Active Member of Clubs or Organizations: Not on file  . Attends Banker Meetings: Not on file  . Marital Status: Not on file  Intimate Partner Violence:   . Fear of Current or Ex-Partner: Not on file  . Emotionally Abused: Not on file  . Physically Abused: Not on file  . Sexually Abused: Not on file   No current facility-administered medications on file prior to encounter.   Current Outpatient Medications on File Prior to Encounter  Medication Sig Dispense Refill  . [START ON 02/01/2020] aspirin EC 81 MG tablet Take 1 tablet (81 mg total) by mouth daily. 30 tablet 5  .  Blood Pressure Monitoring (BLOOD PRESSURE CUFF) MISC 1 Device by Does not apply route once a week. 1 each 0  . prenatal vitamin w/FE, FA (NATACHEW) 29-1 MG CHEW chewable tablet Chew 1 tablet by mouth daily at 12 noon.     No Known Allergies  I have reviewed patient's Past Medical Hx, Surgical Hx, Family Hx, Social Hx, medications and allergies.   ROS:  Review of Systems  Constitutional: Negative for fever.  Gastrointestinal: Negative for abdominal pain and diarrhea.  Genitourinary: Positive for vaginal bleeding. Negative for dysuria and pelvic pain.  Musculoskeletal: Negative for back pain.   Review of  Systems  Other systems negative   Physical Exam  Physical Exam Patient Vitals for the past 24 hrs:  BP Temp Pulse Resp Height Weight  01/20/20 0551 118/63 -- 70 -- -- --  01/20/20 0547 -- 98.6 F (37 C) -- 16 5\' 7"  (1.702 m) 65.8 kg   Constitutional: Well-developed, well-nourished female in no acute distress.  Cardiovascular: normal rate Respiratory: normal effort GI: Abd soft, non-tender. Pos BS x 4 MS: Extremities nontender, no edema, normal ROM Neurologic: Alert and oriented x 4.  GU: Neg CVAT.  PELVIC EXAM: small amount vaginal bleeding with passage of moderate clot  LAB RESULTS No results found for this or any previous visit (from the past 24 hour(s)).  O/Positive/-- (02/16 1527)  IMAGING Bedside US done by me: Pt informed that the ultrasound is considered a limited OB ultrasound and is not intended to be a complete ultrasound exam.  Patient also informed that the ultrasound is not being completed with the intent of assessing for fetal or placental anomalies or any pelvic abnormalities.  Explained that the purpose of today's ultrasound is to assess for presentation, BPP and amniotic fluid volume.  Patient acknowledges the purpose of the exam and the limitations of the study.    Single gestational sac with active fetus measuring c/w 12 weeks Very active FHR 160s No obvious [place] visible  MAU Management/MDM: Reassured patient that baby is still alive Discussed [place] in small to mod range is not statistically associated with SAB Discussed she should expect to have bleeding off and on for several weeks possibly  ASSESSMENT Single IUP at [redacted]w[redacted]d, live Subchorionic hematoma Vaginal bleeding in first trimester   PLAN Discharge home Pelvic rest Reassured Followup with [facility] in office Pt stable at time of discharge. Encouraged to return here or to other Urgent Care/ED if she develops worsening of symptoms, increase in pain, fever, or other concerning symptoms.    Hansel Feinstein CNM, MSN Certified Nurse-Midwife 01/20/2020  6:06 AM

## 2020-01-20 NOTE — Discharge Instructions (Signed)

## 2020-01-20 NOTE — MAU Note (Signed)
Got up to BR about an hour ago and having bright vag bleeding. No pain.

## 2020-01-31 ENCOUNTER — Encounter: Payer: Self-pay | Admitting: Advanced Practice Midwife

## 2020-02-01 ENCOUNTER — Encounter: Payer: Self-pay | Admitting: Advanced Practice Midwife

## 2020-02-04 ENCOUNTER — Encounter: Payer: Self-pay | Admitting: Advanced Practice Midwife

## 2020-02-04 DIAGNOSIS — D573 Sickle-cell trait: Secondary | ICD-10-CM | POA: Insufficient documentation

## 2020-02-07 ENCOUNTER — Other Ambulatory Visit: Payer: Self-pay

## 2020-02-07 MED ORDER — BLOOD PRESSURE CUFF MISC: 1.0000 | 0 refills | Status: DC

## 2020-02-15 ENCOUNTER — Encounter: Payer: Self-pay | Admitting: Advanced Practice Midwife

## 2020-02-16 ENCOUNTER — Encounter: Payer: Self-pay | Admitting: Obstetrics and Gynecology

## 2020-02-17 ENCOUNTER — Telehealth (INDEPENDENT_AMBULATORY_CARE_PROVIDER_SITE_OTHER): Payer: Medicaid Other | Admitting: Family Medicine

## 2020-02-17 ENCOUNTER — Encounter: Payer: Self-pay | Admitting: Family Medicine

## 2020-02-17 DIAGNOSIS — D573 Sickle-cell trait: Secondary | ICD-10-CM

## 2020-02-17 DIAGNOSIS — O99013 Anemia complicating pregnancy, third trimester: Secondary | ICD-10-CM

## 2020-02-17 DIAGNOSIS — Z3A16 16 weeks gestation of pregnancy: Secondary | ICD-10-CM

## 2020-02-17 DIAGNOSIS — Z3480 Encounter for supervision of other normal pregnancy, unspecified trimester: Secondary | ICD-10-CM

## 2020-02-17 NOTE — Patient Instructions (Signed)

## 2020-02-17 NOTE — Progress Notes (Signed)
Virtual Visit via Telephone Note  I connected with Alicia Black on 02/17/20 at 11:00 AM EDT by telephone and verified that I am speaking with the correct person using two identifiers.  Pt does not have her BP cuff. Did not pick up ASA 81  Pt has questions about [place] fetal fraction  Unable to attend in person appt today - AFP due NV

## 2020-02-18 NOTE — Progress Notes (Signed)
    OBSTETRICS PRENATAL VIRTUAL VISIT ENCOUNTER NOTE  Provider location: Center for [facility] [place]   I connected with [name] on 02/18/20 at 11:00 AM EDT by MyChart Video Encounter at home and verified that I am speaking with the correct person using two identifiers.   I discussed the limitations, risks, security and privacy concerns of performing an evaluation and management service virtually and the availability of in person appointments. I also discussed with the patient that there may be a patient responsible charge related to this service. The patient expressed understanding and agreed to proceed. Subjective:  Alicia Black is a 19 y.o. G2P0010 at [redacted]w[redacted]d being seen today for ongoing prenatal care.  She is currently monitored for the following issues for this low-risk pregnancy and has Encounter for supervision of normal pregnancy, antepartum and Sickle cell trait (HCC) on their problem list.  Patient reports no complaints.  Contractions: Not present. Vag. Bleeding: None.  Movement: Absent. Denies any leaking of fluid.   The following portions of the patient's history were reviewed and updated as appropriate: allergies, current medications, past family history, past medical history, past social history, past surgical history and problem list.   Objective:  There were no vitals filed for this visit.  Fetal Status:     Movement: Absent     General:  Alert, oriented and cooperative. Patient is in no acute distress.  Respiratory: Normal respiratory effort, no problems with respiration noted  Mental Status: Normal mood and affect. Normal behavior. Normal judgment and thought content.  Rest of physical exam deferred due to type of encounter  Imaging: No results found.  Assessment and Plan:  Pregnancy: G2P0010 at [redacted]w[redacted]d 1. Supervision of other normal pregnancy, antepartum Continue routine prenatal care.   Preterm labor symptoms and general obstetric precautions  including but not limited to vaginal bleeding, contractions, leaking of fluid and fetal movement were reviewed in detail with the patient. I discussed the assessment and treatment plan with the patient. The patient was provided an opportunity to ask questions and all were answered. The patient agreed with the plan and demonstrated an understanding of the instructions. The patient was advised to call back or seek an in-person office evaluation/go to MAU at [facility] [place] for any urgent or concerning symptoms. Please refer to After Visit Summary for other counseling recommendations.   I provided 10 minutes of face-to-face time during this encounter.  Return in 4 weeks (on 03/16/2020) for AFP (lab visit) with anatomy scan.  Future Appointments  Date Time Provider Department Center  03/09/2020  1:00 PM [facility] [place] 3 WH-MFCUS MFC-US  03/09/2020  2:10 PM [facility] LAB [facility]A WOC  03/16/2020  3:55 PM Sharyon Cable, CNM [facility] None    Reva Bores, MD Center for [facility]

## 2020-03-09 ENCOUNTER — Other Ambulatory Visit: Payer: Self-pay

## 2020-03-09 ENCOUNTER — Other Ambulatory Visit: Payer: Self-pay | Admitting: Advanced Practice Midwife

## 2020-03-09 ENCOUNTER — Other Ambulatory Visit: Payer: Self-pay | Admitting: General Practice

## 2020-03-09 ENCOUNTER — Other Ambulatory Visit: Payer: Medicaid Other

## 2020-03-09 ENCOUNTER — Other Ambulatory Visit (HOSPITAL_COMMUNITY): Payer: Self-pay | Admitting: *Deleted

## 2020-03-09 ENCOUNTER — Ambulatory Visit (HOSPITAL_COMMUNITY)
Admission: RE | Admit: 2020-03-09 | Discharge: 2020-03-09 | Disposition: A | Discharge status: Home / Self Care | Payer: Medicaid Other | Source: Ambulatory Visit | Attending: Obstetrics and Gynecology | Admitting: Obstetrics and Gynecology

## 2020-03-09 DIAGNOSIS — Z363 Encounter for antenatal screening for malformations: Secondary | ICD-10-CM

## 2020-03-09 DIAGNOSIS — Z3A19 19 weeks gestation of pregnancy: Secondary | ICD-10-CM | POA: Diagnosis not present

## 2020-03-09 DIAGNOSIS — Z3480 Encounter for supervision of other normal pregnancy, unspecified trimester: Secondary | ICD-10-CM

## 2020-03-09 DIAGNOSIS — Z862 Personal history of diseases of the blood and blood-forming organs and certain disorders involving the immune mechanism: Secondary | ICD-10-CM

## 2020-03-09 DIAGNOSIS — Z362 Encounter for other antenatal screening follow-up: Secondary | ICD-10-CM

## 2020-03-11 LAB — AFP, SERUM, OPEN SPINA BIFIDA
AFP MoM: 1.88
AFP Value: 109.7 ng/mL
Gest. Age on Collection Date: 19.2 weeks
Maternal Age At EDD: 19.9 yr
OSBR Risk 1 IN: 2116
Test Results:: NEGATIVE
Weight: 147 [lb_av]

## 2020-03-16 ENCOUNTER — Encounter: Payer: Medicaid Other | Admitting: Certified Nurse Midwife

## 2020-03-19 ENCOUNTER — Inpatient Hospital Stay (HOSPITAL_BASED_OUTPATIENT_CLINIC_OR_DEPARTMENT_OTHER): Payer: Medicaid Other

## 2020-03-19 ENCOUNTER — Other Ambulatory Visit: Payer: Self-pay

## 2020-03-19 ENCOUNTER — Encounter (HOSPITAL_COMMUNITY): Payer: Self-pay | Admitting: Obstetrics and Gynecology

## 2020-03-19 ENCOUNTER — Inpatient Hospital Stay (HOSPITAL_COMMUNITY)
Admission: AD | Admit: 2020-03-19 | Discharge: 2020-03-19 | Disposition: A | Discharge status: Home / Self Care | Payer: Medicaid Other | Attending: Obstetrics and Gynecology | Admitting: Obstetrics and Gynecology

## 2020-03-19 DIAGNOSIS — M545 Low back pain: Secondary | ICD-10-CM | POA: Insufficient documentation

## 2020-03-19 DIAGNOSIS — Z3686 Encounter for antenatal screening for cervical length: Secondary | ICD-10-CM

## 2020-03-19 DIAGNOSIS — Z3A20 20 weeks gestation of pregnancy: Secondary | ICD-10-CM

## 2020-03-19 DIAGNOSIS — R109 Unspecified abdominal pain: Secondary | ICD-10-CM | POA: Diagnosis not present

## 2020-03-19 DIAGNOSIS — Z7982 Long term (current) use of aspirin: Secondary | ICD-10-CM | POA: Diagnosis not present

## 2020-03-19 DIAGNOSIS — O26899 Other specified pregnancy related conditions, unspecified trimester: Secondary | ICD-10-CM

## 2020-03-19 DIAGNOSIS — Z862 Personal history of diseases of the blood and blood-forming organs and certain disorders involving the immune mechanism: Secondary | ICD-10-CM

## 2020-03-19 DIAGNOSIS — O26892 Other specified pregnancy related conditions, second trimester: Secondary | ICD-10-CM | POA: Insufficient documentation

## 2020-03-19 DIAGNOSIS — N949 Unspecified condition associated with female genital organs and menstrual cycle: Secondary | ICD-10-CM | POA: Diagnosis not present

## 2020-03-19 DIAGNOSIS — R102 Pelvic and perineal pain: Secondary | ICD-10-CM | POA: Insufficient documentation

## 2020-03-19 DIAGNOSIS — Z87891 Personal history of nicotine dependence: Secondary | ICD-10-CM | POA: Diagnosis not present

## 2020-03-19 HISTORY — DX: Sickle-cell trait: D57.3

## 2020-03-19 LAB — URINALYSIS, ROUTINE W REFLEX MICROSCOPIC
Bacteria, UA: NONE SEEN
Bilirubin Urine: NEGATIVE
Glucose, UA: NEGATIVE mg/dL
Hgb urine dipstick: NEGATIVE
Ketones, ur: NEGATIVE mg/dL
Nitrite: NEGATIVE
Protein, ur: NEGATIVE mg/dL
Specific Gravity, Urine: 1.011 (ref 1.005–1.030)
pH: 8 (ref 5.0–8.0)

## 2020-03-19 MED ORDER — COMFORT FIT MATERNITY SUPP LG MISC: 1.0000 | Freq: Every day | 0 refills | Status: DC | PRN

## 2020-03-19 MED ORDER — ACETAMINOPHEN 500 MG PO TABS
1000.0000 mg | ORAL_TABLET | Freq: Once | ORAL | Status: AC
  Administered 2020-03-19: 12:00:00 1000 mg via ORAL
  Filled 2020-03-19: qty 2

## 2020-03-19 NOTE — MAU Note (Signed)
Alicia Black is a 19 y.o. at [redacted]w[redacted]d here in MAU reporting: back pain and both of her sides are hurting since last night. States pain is worse. "Kept having to pee", no burning or urgency. No abnormal discharge or bleeding. No LOF.  Onset of complaint: last night  Pain score: 7/10  Vitals:   03/19/20 1117  BP: 117/71  Pulse: 78  Resp: 16  Temp: 99 F (37.2 C)  SpO2: 100%     FHT: 154  Lab orders placed from triage: UA

## 2020-03-19 NOTE — Discharge Instructions (Signed)
Round Ligament Pain During Pregnancy   Round ligament pain is a sharp pain or jabbing feeling often felt in the lower belly or groin area on one or both sides. It is one of the most common complaints during pregnancy and is considered a normal part of pregnancy. It is most often felt during the second trimester.   Here is what you need to know about round ligament pain, including some tips to help you feel better.   Causes of Round Ligament Pain:    Several thick ligaments surround and support your womb (uterus) as it grows during pregnancy. One of them is called the round ligament.   The round ligament connects the front part of the womb to your groin, the area where your legs attach to your pelvis. The round ligament normally tightens and relaxes slowly.   As your baby and womb grow, the round ligament stretches. That makes it more likely to become strained.   Sudden movements can cause the ligament to tighten quickly, like a rubber band snapping. This causes a sudden and quick jabbing feeling.   Symptoms of Round Ligament Pain   Round ligament pain can be concerning and uncomfortable. But it is considered normal as your body changes during pregnancy.   The symptoms of round ligament pain include a sharp, sudden spasm in the belly. It usually affects the right side, but it may happen on both sides. The pain only lasts a few seconds.   Exercise may cause the pain, as will rapid movements such as:   sneezing  coughing  laughing  rolling over in bed  standing up too quickly   Treatment of Round Ligament Pain   Here are some tips that may help reduce your discomfort:   Pain relief. Take over-the-counter acetaminophen for pain, if necessary. Ask your doctor if this is OK.   Exercise. Get plenty of exercise to keep your stomach (core) muscles strong. Doing stretching exercises or prenatal yoga can be helpful. Ask your doctor which exercises are safe for you and your baby.   A  helpful exercise involves putting your hands and knees on the floor, lowering your head, and pushing your backside into the air.   Avoid sudden movements. Change positions slowly (such as standing up or sitting down) to avoid sudden movements that may cause stretching and pain.   Flex your hips. Bend and flex your hips before you cough, sneeze, or laugh to avoid pulling on the ligaments.   Apply warmth. A heating pad or warm bath may be helpful. Ask your doctor if this is OK. Extreme heat can be dangerous to the baby.   You should try to modify your daily activity level and avoid positions that may worsen the condition.   When to Call the Doctor/Midwife   Always tell your doctor or midwife about any type of pain you have during pregnancy. Round ligament pain is quick and doesn't last long.   Call your health care provider immediately if you have:   severe pain  fever  chills  pain on urination  difficulty walking   Belly pain during pregnancy can be due to many different causes. It is important for your doctor to rule out more serious conditions, including pregnancy complications such as placenta abruption or non-pregnancy illnesses such as:   inguinal hernia  appendicitis  stomach, liver, and kidney problems  Preterm labor pains may sometimes be mistaken for round ligament pain.              PREGNANCY SUPPORT BELT: You are not alone, Seventy-five percent of women have some sort of abdominal or back pain at some point in their pregnancy. Your baby is growing at a fast pace, which means that your whole body is rapidly trying to adjust to the changes. As your uterus grows, your back may start feeling a bit under stress and this can result in back or abdominal pain that can go from mild, and therefore bearable, to severe pains that will not allow you to sit or lay down comfortably, When it comes to dealing with pregnancy-related pains and cramps, some pregnant women usually prefer  natural remedies, which the market is filled with nowadays. For example, wearing a pregnancy support belt can help ease and lessen your discomfort and pain. WHAT ARE THE BENEFITS OF WEARING A PREGNANCY SUPPORT BELT? A pregnancy support belt provides support to the lower portion of the belly taking some of the weight of the growing uterus and distributing to the other parts of your body. It is designed make you comfortable and gives you extra support. Over the years, the pregnancy apparel market has been studying the needs and wants of pregnant women and they have come up with the most comfortable pregnancy support belts that woman could ever ask for. In fact, you will no longer have to wear a stretched-out or bulky pregnancy belt that is visible underneath your clothes and makes you feel even more uncomfortable. Nowadays, a pregnancy support belt is made of comfortable and stretchy materials that will not irritate your skin but will actually make you feel at ease and you will not even notice you are wearing it. They are easy to put on and adjust during the day and can be worn at night for additional support.  BENEFITS: . Relives Back pain . Relieves Abdominal Muscle and Leg Pain . Stabilizes the Pelvic Ring . Offers a Cushioned Abdominal Lift Pad . Relieves pressure on the Sciatic Nerve Within Minutes WHERE TO GET YOUR PREGNANCY BELT: [employer] [phone] @[address] [place], [place] [address]  

## 2020-03-19 NOTE — MAU Provider Note (Signed)
History     CSN: [id number]  Arrival date and time: 03/19/20 1104   First Provider Initiated Contact with Patient 03/19/20 1144      Chief Complaint  Patient presents with  . Back Pain   HPI  [name] is 19 y.o. female G29P0010 @ 70w3dhere with lower back pain that started last night. States the pain at times radiates around to both sides/ hips and lower abdomen. The pain at times is constant. The pain worsened when she attempted to lay flat on her back. The pain also worsens when she walks or tries to sit up straight in bed. No injury recently. She has not tried any medications for the pain. No urinary symptoms. No bleeding.   OB History    Gravida  2   Para  0   Term      Preterm      AB  1   Living  0     SAB  1   TAB      Ectopic      Multiple      Live Births              Past Medical History:  Diagnosis Date  . Medical history non-contributory   . Sickle cell trait ([facility]     Past Surgical History:  Procedure Laterality Date  . NO PAST SURGERIES      Family History  Problem Relation Age of Onset  . Asthma Father   . Asthma Sister     Social History   Tobacco Use  . Smoking status: Former Smoker    Types: Cigars  . Smokeless tobacco: Never Used  Substance Use Topics  . Alcohol use: Never  . Drug use: Not Currently    Types: Marijuana    Allergies: No Known Allergies  Medications Prior to Admission  Medication Sig Dispense Refill Last Dose  . aspirin EC 81 MG tablet Take 1 tablet (81 mg total) by mouth daily. 30 tablet 5   . Blood Pressure Monitoring (BLOOD PRESSURE CUFF) MISC 1 kit by Does not apply route once a week. (Patient not taking: Reported on 02/17/2020) 1 each 0   . Blood Pressure Monitoring (BLOOD PRESSURE CUFF) MISC 1 Device by Does not apply route once a week. (Patient not taking: Reported on 02/17/2020) 1 each 0   . prenatal vitamin w/FE, FA (NATACHEW) 29-1 MG CHEW chewable tablet Chew 1 tablet by mouth daily at  12 noon.      Results for orders placed or performed during the hospital encounter of 03/19/20 (from the past 48 hour(s))  Urinalysis, Routine w reflex microscopic     Status: Abnormal   Collection Time: 03/19/20 11:45 AM  Result Value Ref Range   Color, Urine YELLOW YELLOW   APPearance HAZY (A) CLEAR   Specific Gravity, Urine 1.011 1.005 - 1.030   pH 8.0 5.0 - 8.0   Glucose, UA NEGATIVE NEGATIVE mg/dL   Hgb urine dipstick NEGATIVE NEGATIVE   Bilirubin Urine NEGATIVE NEGATIVE   Ketones, ur NEGATIVE NEGATIVE mg/dL   Protein, ur NEGATIVE NEGATIVE mg/dL   Nitrite NEGATIVE NEGATIVE   Leukocytes,Ua LARGE (A) NEGATIVE   RBC / HPF 0-5 0 - 5 RBC/hpf   WBC, UA 6-10 0 - 5 WBC/hpf   Bacteria, UA NONE SEEN NONE SEEN   Squamous Epithelial / LPF 11-20 0 - 5   Non Squamous Epithelial 0-5 (A) NONE SEEN    Comment: [facility], [address] [place], [place] [address]   [place] MFM OB Transvaginal  Result Date: 03/19/2020 ----------------------------------------------------------------------  OBSTETRICS REPORT                       (Signed Final 03/19/2020 06:47 pm) ---------------------------------------------------------------------- Patient Info  ID #:       [id number]                          D.O.B.:  02-03-2001 (18 yrs)  Name:       Alicia Black                   Visit Date: 03/19/2020 12:31 pm ---------------------------------------------------------------------- Performed By  Performed By:     Wilnette Kales        Ref. Address:      [place]                                                              [place]  Attending:        Johnell Comings MD         [facility]  Referred By:      Elvera Maria CNM  ---------------------------------------------------------------------- Orders   #  Description                          Code         Ordered By   1  [place] MFM OB TRANSVAGINAL               [phone]      Braydyn Schultes [facility]  ----------------------------------------------------------------------   #  Order #                    Accession #                 Episode #   1  [id number]                  [id number]                  [id number]  ---------------------------------------------------------------------- Indications   Encounter for cervical length  Z36.86   History of sickle cell trait                   Z86.2   Abdominal pain in pregnancy                    O99.89   Low Risk NIPS   [redacted] weeks gestation of pregnancy                Z3A.20  ---------------------------------------------------------------------- Fetal Evaluation  Num Of Fetuses:          1  Fetal Heart Rate(bpm):   157  Cardiac Activity:        Observed  Presentation:            Cephalic  Placenta:                Right fundo-lateral  P. Cord Insertion:       Visualized, central  Amniotic Fluid  AFI FV:      Within normal limits                              Largest Pocket(cm)                              4.25  Comment:    No placental abruption or previa identified. ---------------------------------------------------------------------- OB History  Gravidity:    2          SAB:   1 ---------------------------------------------------------------------- Gestational Age  LMP:           28w 6d        Date:  08/30/19                 EDD:   06/05/20  Best:          [name] 5d     Det. By:  Loman Chroman         EDD:   08/01/20                                      (01/10/20) ---------------------------------------------------------------------- Anatomy  Thoracic:              Appears normal         Abdomen:                Appears normal  Heart:                 Echogenic focus        Abdominal Wall:         Appears nml (cord                         in LV                                                                         insert, abd wall)  RVOT:                  Appears normal  Cord Vessels:           Appears normal (3                                                                        vessel cord)  LVOT:                  Appears normal         Kidneys:                Appear normal  Ductal Arch:           Appears normal         Bladder:                Appears normal  Stomach:               Appears normal, left                         sided ---------------------------------------------------------------------- Cervix Uterus Adnexa  Cervix  Length:           3.32  cm.  Normal appearance by transvaginal scan  Uterus  No abnormality visualized.  Cul De Sac  No free fluid seen. ---------------------------------------------------------------------- Comments  A transvaginal ultrasound performed today showed a cervical  length of 3.32 cm long without any signs of funneling.  The fetus is in the vertex presentation.  There was normal amniotic fluid noted. ----------------------------------------------------------------------                   Johnell Comings, MD Electronically Signed Final Report   03/19/2020 06:47 pm ----------------------------------------------------------------------   Review of Systems  Constitutional: Negative for fever.  Gastrointestinal: Positive for abdominal pain.  Genitourinary: Positive for pelvic pain. Negative for vaginal bleeding and vaginal discharge.   Physical Exam   Blood pressure 117/71, pulse 78, temperature 99 F (37.2 C), temperature source Oral, resp. rate 16, height 5' 7"  (1.702 m), weight 68.2 kg, last menstrual period 08/30/2019, SpO2 100 %.  Physical Exam  Constitutional: She is oriented to person, place, and time. She appears well-developed and well-nourished. No distress.  HENT:  Head: Normocephalic.  Eyes: Pupils are equal, round, and reactive to light.  GI: Soft. She exhibits no distension. There is no  abdominal tenderness. There is no rebound.  Genitourinary:    No vaginal tenderness or bleeding.  No tenderness or bleeding in the vagina.    Genitourinary Comments: Cervix: closed, thick, posterior.   Musculoskeletal:        General: Normal range of motion.  Neurological: She is alert and oriented to person, place, and time.  Skin: Skin is warm. She is not diaphoretic.  Psychiatric: Her behavior is normal.   MAU Course  Procedures  None  MDM  + Fetal heart tones via doppler  Cervix long/closed Symptoms are consistent with round ligament pain.  Tylenol 1 gram given, pain down to 0/10  Assessment and Plan   A:  1. Round ligament pain   2. Abdominal pain in pregnancy   3. [redacted] weeks gestation of pregnancy     P:  Discharge home in stable condition Rx: pregnancy support belt Ok to use tylenol  OTC as directed on the bottle Increase oral fluid intake Urine culture pending Return to MAU if symptoms worsen   Ciana Simmon, Artist Pais, NP 03/19/2020 6:54 PM

## 2020-03-20 ENCOUNTER — Encounter: Payer: Self-pay | Admitting: Obstetrics

## 2020-03-20 ENCOUNTER — Ambulatory Visit (INDEPENDENT_AMBULATORY_CARE_PROVIDER_SITE_OTHER): Payer: Medicaid Other | Admitting: Obstetrics

## 2020-03-20 VITALS — BP 114/64 | HR 73 | Wt 149.3 lb

## 2020-03-20 DIAGNOSIS — O99013 Anemia complicating pregnancy, third trimester: Secondary | ICD-10-CM

## 2020-03-20 DIAGNOSIS — Z3480 Encounter for supervision of other normal pregnancy, unspecified trimester: Secondary | ICD-10-CM

## 2020-03-20 DIAGNOSIS — Z3A20 20 weeks gestation of pregnancy: Secondary | ICD-10-CM

## 2020-03-20 DIAGNOSIS — D573 Sickle-cell trait: Secondary | ICD-10-CM

## 2020-03-20 LAB — CULTURE, OB URINE

## 2020-03-20 NOTE — Progress Notes (Signed)
Subjective:  Alicia Black is a 19 y.o. G2P0010 at [redacted]w[redacted]d being seen today for ongoing prenatal care.  She is currently monitored for the following issues for this low-risk pregnancy and has Encounter for supervision of normal pregnancy, antepartum and Sickle cell trait (HCC) on their problem list.  Patient reports no complaints.  Contractions: Not present. Vag. Bleeding: None.  Movement: Present. Denies leaking of fluid.   The following portions of the patient's history were reviewed and updated as appropriate: allergies, current medications, past family history, past medical history, past social history, past surgical history and problem list. Problem list updated.  Objective:   Vitals:   03/20/20 0829  BP: 114/64  Pulse: 73  Weight: 149 lb 4.8 oz (67.7 kg)    Fetal Status:     Movement: Present     General:  Alert, oriented and cooperative. Patient is in no acute distress.  Skin: Skin is warm and dry. No rash noted.   Cardiovascular: Normal heart rate noted  Respiratory: Normal respiratory effort, no problems with respiration noted  Abdomen: Soft, gravid, appropriate for gestational age. Pain/Pressure: Absent     Pelvic:  Cervical exam deferred        Extremities: Normal range of motion.  Edema: None  Mental Status: Normal mood and affect. Normal behavior. Normal judgment and thought content.   Urinalysis:      Assessment and Plan:  Pregnancy: G2P0010 at [redacted]w[redacted]d  1. Supervision of other normal pregnancy, antepartum   Preterm labor symptoms and general obstetric precautions including but not limited to vaginal bleeding, contractions, leaking of fluid and fetal movement were reviewed in detail with the patient. Please refer to After Visit Summary for other counseling recommendations.   Return in about 4 weeks (around 04/17/2020) for MyChart.   Brock Bad, MD  03-20-2020

## 2020-03-20 NOTE — Progress Notes (Signed)
Patient reports fetal movement, denies pain. Pt states that she lost BP cuff.

## 2020-04-03 ENCOUNTER — Encounter: Payer: Self-pay | Admitting: Obstetrics

## 2020-04-03 ENCOUNTER — Telehealth (INDEPENDENT_AMBULATORY_CARE_PROVIDER_SITE_OTHER): Payer: Medicaid Other | Admitting: Obstetrics

## 2020-04-03 DIAGNOSIS — Z3480 Encounter for supervision of other normal pregnancy, unspecified trimester: Secondary | ICD-10-CM

## 2020-04-03 DIAGNOSIS — Z3A22 22 weeks gestation of pregnancy: Secondary | ICD-10-CM

## 2020-04-03 DIAGNOSIS — D573 Sickle-cell trait: Secondary | ICD-10-CM

## 2020-04-03 DIAGNOSIS — O99013 Anemia complicating pregnancy, third trimester: Secondary | ICD-10-CM

## 2020-04-03 MED ORDER — BLOOD PRESSURE CUFF MISC: 1.0000 | 0 refills | Status: DC

## 2020-04-03 NOTE — Progress Notes (Signed)
OBSTETRICS PRENATAL VIRTUAL VISIT ENCOUNTER NOTE  [facility]   I connected with Alicia Black at  1:30 PM EDT by MyChart Video Encounter at home and verified that I am speaking with the correct person using two identifiers.   I discussed the limitations, risks, security and privacy concerns of performing an evaluation and management service virtually and the availability of in person appointments. I also discussed with the patient that there may be a patient responsible charge related to this service. The patient expressed understanding and agreed to proceed. Subjective:  Alicia Black is a 19 y.o. G2P0010 at 6160w4d being seen today for ongoing prenatal care.  She is currently monitored for the following issues for this low-risk pregnancy and has Encounter for supervision of normal pregnancy, antepartum and Sickle cell trait (HCC) on their problem list.  Patient reports no complaints.   .  .   . Denies any leaking of fluid.   The following portions of the patient's history were reviewed and updated as appropriate: allergies, current medications, past family history, past medical history, past social history, past surgical history and problem list.   Objective:  There were no vitals filed for this visit.  Fetal Status:           General:  Alert, oriented and cooperative. Patient is in no acute distress.  Respiratory: Normal respiratory effort, no problems with respiration noted  Mental Status: Normal mood and affect. Normal behavior. Normal judgment and thought content.  Rest of physical exam deferred due to type of encounter  Imaging: US MFM OB Transvaginal  Result Date: 03/19/2020 ----------------------------------------------------------------------  OBSTETRICS REPORT                       (Signed Final 03/19/2020 06:47 pm) ---------------------------------------------------------------------- Patient Info  ID #:        [id number]                          D.O.B.:  2001-04-25 (18 yrs)  Name:       Alicia Black                   Visit Date: 03/19/2020 12:31 pm ---------------------------------------------------------------------- Performed By  Performed By:     Birdena CrandallYasemin Karatas        [address]      [address]                    [address]                                                              [address]                                                              [place]  Attending:        Ma RingsVictor Fang MD         [facility]  Referred By:      Wilmer Floor LEFTWICH-                    Craige Cotta CNM ---------------------------------------------------------------------- Orders   #  Description                          Code         Ordered By   1  [place] MFM OB TRANSVAGINAL               [phone]      [facility]  ----------------------------------------------------------------------   #  Order #                    Accession #                 Episode #   1  [id number]                  [id number]                  [id number]  ---------------------------------------------------------------------- Indications   Encounter for cervical length                  Z36.86   History of sickle cell trait                   Z86.2   Abdominal pain in pregnancy                    O99.89   Low Risk NIPS   [redacted] weeks gestation of pregnancy                Z3A.20  ---------------------------------------------------------------------- Fetal Evaluation  Num Of Fetuses:          1  Fetal Heart Rate(bpm):   157  Cardiac Activity:        Observed  Presentation:            Cephalic  Placenta:                Right fundo-lateral  P. Cord Insertion:       Visualized, central  Amniotic Fluid  AFI FV:      Within normal limits                              Largest Pocket(cm)                              4.25  Comment:    No placental abruption or previa identified.  ---------------------------------------------------------------------- OB History  Gravidity:    2          SAB:   1 ---------------------------------------------------------------------- Gestational Age  LMP:           28w 6d        Date:  08/30/19                 EDD:   06/05/20  Best:          Cherylann Parr 5d     Det. ByMarcella Dubs         EDD:   08/01/20                                      (  01/10/20) ---------------------------------------------------------------------- Anatomy  Thoracic:              Appears normal         Abdomen:                Appears normal  Heart:                 Echogenic focus        Abdominal Wall:         Appears nml (cord                         in LV                                                                        insert, abd wall)  RVOT:                  Appears normal         Cord Vessels:           Appears normal (3                                                                        vessel cord)  LVOT:                  Appears normal         Kidneys:                Appear normal  Ductal Arch:           Appears normal         Bladder:                Appears normal  Stomach:               Appears normal, left                         sided ---------------------------------------------------------------------- Cervix Uterus Adnexa  Cervix  Length:           3.32  cm.  Normal appearance by transvaginal scan  Uterus  No abnormality visualized.  Cul De Sac  No free fluid seen. ---------------------------------------------------------------------- Comments  A transvaginal ultrasound performed today showed a cervical  length of 3.32 cm long without any signs of funneling.  The fetus is in the vertex presentation.  There was normal amniotic fluid noted. ----------------------------------------------------------------------                   Ma Rings, MD Electronically Signed Final Report   03/19/2020 06:47 pm  ----------------------------------------------------------------------  [place] MFM OB DETAIL +14 WK  Result Date: 03/09/2020 ----------------------------------------------------------------------  OBSTETRICS REPORT                       (Signed Final 03/09/2020 03:17 pm) ---------------------------------------------------------------------- Patient Info  ID #:  [id number]                          D.O.B.:  [birth year] (18 yrs)  Name:       [facility]                   Visit Date: 03/09/2020 12:53 pm ---------------------------------------------------------------------- Performed By  Performed By:     Tomma Lightning             Ref. Address:     [address]                    [address]                                                             [address]                                                             [place]  Attending:        Ma Rings MD         [facility]  Referred By:      Wilmer Floor LEFTWICH-                    Craige Cotta CNM ---------------------------------------------------------------------- Orders   #  Description                          Code         Ordered By   1  [place] MFM OB DETAIL +14 WK              76811.01     LISA [facility]-                                                        KIRBY  ----------------------------------------------------------------------   #  Order #                    Accession #                 Episode #   1  [id number]                  [id number]                  [id number]  ---------------------------------------------------------------------- Indications   History of sickle cell trait  P29.5   Encounter for antenatal screening for          Z36.3   malformations   [redacted] weeks gestation of pregnancy                Z3A.19   Low Risk NIPS  ---------------------------------------------------------------------- Fetal Evaluation  Num Of Fetuses:         1  Fetal Heart  Rate(bpm):  158  Cardiac Activity:       Observed  Presentation:           Cephalic  Placenta:               Fundal  P. Cord Insertion:      Visualized  Amniotic Fluid  AFI FV:      Within normal limits                              Largest Pocket(cm)                              4.7 ---------------------------------------------------------------------- Biometry  BPD:      42.1  mm     G. Age:  18w 5d         27  %    CI:        72.79   %    70 - 86                                                          FL/HC:      16.8   %    16.1 - 18.3  HC:      156.9  mm     G. Age:  18w 4d         14  %    HC/AC:      1.17        1.09 - 1.39  AC:      134.4  mm     G. Age:  18w 6d         33  %    FL/BPD:     62.5   %  FL:       26.3  mm     G. Age:  18w 0d          8  %    FL/AC:      19.6   %    20 - 24  HUM:      27.4  mm     G. Age:  18w 5d         37  %  Est. FW:     243  gm      0 lb 9 oz     11  % ---------------------------------------------------------------------- OB History  Gravidity:    2          SAB:   1 ---------------------------------------------------------------------- Gestational Age  LMP:           27w 3d        Date:  08/30/19                 EDD:   06/05/20  U/S Today:     18w  4d                                        EDD:   08/06/20  Best:          19w 2d     Det. ByLoman Chroman         EDD:   08/01/20                                      (01/10/20) ---------------------------------------------------------------------- Anatomy  Cranium:               Appears normal         LVOT:                   Not well visualized  Cavum:                 Appears normal         Aortic Arch:            Not well visualized  Ventricles:            Appears normal         Ductal Arch:            Not well visualized  Choroid Plexus:        Appears normal         Diaphragm:              Not well visualized  Cerebellum:            Not well visualized    Stomach:                Appears normal, left                                                                         sided  Posterior Fossa:       Not well visualized    Abdomen:                Appears normal  Nuchal Fold:           Not well visualized    Abdominal Wall:         Appears nml (cord                                                                        insert, abd wall)  Face:                  Orbits nl; profile not Cord Vessels:           Appears normal (3                         well visualized  vessel cord)  Lips:                  Not well visualized    Kidneys:                Appear normal  Palate:                Not well visualized    Bladder:                Appears normal  Thoracic:              Appears normal         Spine:                  Not well visualized  Heart:                 Not well visualized    Upper Extremities:      Appears normal  RVOT:                  Not well visualized    Lower Extremities:      Appears normal  Other:  Heels visualized.  Technically difficult due to fetal position. ---------------------------------------------------------------------- Cervix Uterus Adnexa  Cervix  Length:            2.9  cm.  Normal appearance by transabdominal scan.  Uterus  No abnormality visualized.  Left Ovary  Within normal limits.  Right Ovary  Within normal limits. 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  She does not  know if the father the baby will be screened to determine if he  is a carrier for sickle cell disease.  She denies any other significant past medical history and  denies any problems in her current pregnancy.  She had a cell free DNA test earlier in her pregnancy which  indicated a low risk for trisomy 92, 95, and 13. A female fetus  is predicted.  She was informed that the fetal growth and amniotic fluid  level were appropriate for her gestational age.  The  views of the fetal anatomy were limited today due to the  fetal position.  The patient was informed that anomalies may be missed due  to technical limitations. If the fetus is in a suboptimal position  or maternal habitus is increased, visualization of the fetus in  the maternal uterus may be impaired.  A follow-up exam was scheduled in 4 weeks to complete the  views of the fetal anatomy. ----------------------------------------------------------------------                   Ma Rings, MD Electronically Signed Final Report   03/09/2020 03:17 pm ----------------------------------------------------------------------   Assessment and Plan:  Pregnancy: G2P0010 at [redacted]w[redacted]d 1. Supervision of other normal pregnancy, antepartum Rx: - Blood Pressure Monitoring (BLOOD PRESSURE CUFF) MISC; 1 Device by Does not apply route once a week.  Dispense: 1 each; Refill: 0  Preterm labor symptoms and general obstetric precautions including but not limited to vaginal bleeding, contractions, leaking of fluid and fetal movement were reviewed in detail with the patient. I discussed the assessment and treatment plan with the patient. The patient was provided an opportunity to ask questions and all were answered. The patient agreed with the plan and demonstrated an understanding of the instructions. The patient was advised to call back or  seek an in-person office evaluation/go to [facility]U at [facility] for any urgent or concerning symptoms. Please refer to After Visit Summary for other counseling recommendations.   I provided 10 minutes of face-to-face time during this encounter.  Return in about 4 weeks (around 05/01/2020) for MyChart.  Future Appointments  Date Time Provider Department Center  04/07/2020  7:30 AM [facility] US5 [facility]    Coral Ceo, MD [facility] for [facility] 04/03/2020

## 2020-04-07 ENCOUNTER — Other Ambulatory Visit: Payer: Self-pay | Admitting: *Deleted

## 2020-04-07 ENCOUNTER — Telehealth: Payer: Self-pay | Admitting: Licensed Clinical Social Worker

## 2020-04-07 ENCOUNTER — Ambulatory Visit (HOSPITAL_COMMUNITY): Payer: Medicaid Other | Attending: Obstetrics and Gynecology

## 2020-04-07 ENCOUNTER — Other Ambulatory Visit: Payer: Self-pay

## 2020-04-07 DIAGNOSIS — Z362 Encounter for other antenatal screening follow-up: Secondary | ICD-10-CM | POA: Diagnosis not present

## 2020-04-07 DIAGNOSIS — Z862 Personal history of diseases of the blood and blood-forming organs and certain disorders involving the immune mechanism: Secondary | ICD-10-CM | POA: Diagnosis not present

## 2020-04-07 DIAGNOSIS — Z3A23 23 weeks gestation of pregnancy: Secondary | ICD-10-CM

## 2020-04-07 DIAGNOSIS — O99891 Other specified diseases and conditions complicating pregnancy: Secondary | ICD-10-CM

## 2020-04-07 NOTE — Telephone Encounter (Signed)
Patient returned called. Briefly discussed contraception will go into further detail and will complete phq9 and gad7 screening during next in person visit.

## 2020-04-07 NOTE — Telephone Encounter (Signed)
Called pt to complete contraception counseling and phq9 and gad7 screenings. Left message for callback.

## 2020-04-10 ENCOUNTER — Encounter: Payer: Medicaid Other | Admitting: Licensed Clinical Social Worker

## 2020-04-12 ENCOUNTER — Encounter (HOSPITAL_COMMUNITY): Payer: Self-pay | Admitting: Obstetrics and Gynecology

## 2020-04-12 ENCOUNTER — Inpatient Hospital Stay (HOSPITAL_COMMUNITY)
Admission: AD | Admit: 2020-04-12 | Discharge: 2020-04-12 | Disposition: A | Discharge status: Home / Self Care | Payer: Medicaid Other | Attending: Obstetrics and Gynecology | Admitting: Obstetrics and Gynecology

## 2020-04-12 ENCOUNTER — Other Ambulatory Visit: Payer: Self-pay

## 2020-04-12 DIAGNOSIS — Z79899 Other long term (current) drug therapy: Secondary | ICD-10-CM | POA: Diagnosis not present

## 2020-04-12 DIAGNOSIS — Z7982 Long term (current) use of aspirin: Secondary | ICD-10-CM | POA: Insufficient documentation

## 2020-04-12 DIAGNOSIS — D573 Sickle-cell trait: Secondary | ICD-10-CM | POA: Diagnosis not present

## 2020-04-12 DIAGNOSIS — Z3A23 23 weeks gestation of pregnancy: Secondary | ICD-10-CM | POA: Insufficient documentation

## 2020-04-12 DIAGNOSIS — R1031 Right lower quadrant pain: Secondary | ICD-10-CM | POA: Insufficient documentation

## 2020-04-12 DIAGNOSIS — O99012 Anemia complicating pregnancy, second trimester: Secondary | ICD-10-CM | POA: Insufficient documentation

## 2020-04-12 DIAGNOSIS — Z825 Family history of asthma and other chronic lower respiratory diseases: Secondary | ICD-10-CM | POA: Diagnosis not present

## 2020-04-12 DIAGNOSIS — R109 Unspecified abdominal pain: Secondary | ICD-10-CM | POA: Diagnosis not present

## 2020-04-12 DIAGNOSIS — O26892 Other specified pregnancy related conditions, second trimester: Secondary | ICD-10-CM | POA: Insufficient documentation

## 2020-04-12 DIAGNOSIS — Z87891 Personal history of nicotine dependence: Secondary | ICD-10-CM | POA: Diagnosis not present

## 2020-04-12 LAB — URINALYSIS, ROUTINE W REFLEX MICROSCOPIC
Bilirubin Urine: NEGATIVE
Glucose, UA: NEGATIVE mg/dL
Hgb urine dipstick: NEGATIVE
Ketones, ur: NEGATIVE mg/dL
Nitrite: NEGATIVE
Protein, ur: NEGATIVE mg/dL
Specific Gravity, Urine: 1.009 (ref 1.005–1.030)
pH: 7 (ref 5.0–8.0)

## 2020-04-12 NOTE — MAU Note (Signed)
Side is just hurting like really really bad.  When she was peeing, it got worse, if she could have stopped she would have.  Hurts when she walks, just when with any movement it hurts. (rt side).  Actually started hurting yesterday afternoon, she just tried to ignore. Started again around 6 tonight.

## 2020-04-12 NOTE — Discharge Instructions (Signed)
Abdominal Pain During Pregnancy  Belly (abdominal) pain is common during pregnancy. There are many possible causes. Most of the time, it is not a serious problem. Other times, it can be a sign that something is wrong with the pregnancy. Always tell your doctor if you have belly pain. Follow these instructions at home:  Do not have sex or put anything in your vagina until your pain goes away completely.  Get plenty of rest until your pain gets better.  Drink enough fluid to keep your pee (urine) pale yellow.  Take over-the-counter and prescription medicines only as told by your doctor.  Keep all follow-up visits as told by your doctor. This is important. Contact a doctor if:  Your pain continues or gets worse after resting.  You have lower belly pain that: ? Comes and goes at regular times. ? Spreads to your back. ? Feels like menstrual cramps.  You have pain or burning when you pee (urinate). Get help right away if:  You have a fever or chills.  You have vaginal bleeding.  You are leaking fluid from your vagina.  You are passing tissue from your vagina.  You throw up (vomit) for more than 24 hours.  You have watery poop (diarrhea) for more than 24 hours.  Your baby is moving less than usual.  You feel very weak or faint.  You have shortness of breath.  You have very bad pain in your upper belly. Summary  Belly (abdominal) pain is common during pregnancy. There are many possible causes.  If you have belly pain during pregnancy, tell your doctor right away.  Keep all follow-up visits as told by your doctor. This is important. This information is not intended to replace advice given to you by your health care provider. Make sure you discuss any questions you have with your health care provider. Document Revised: 03/08/2019 Document Reviewed: 02/20/2017 Elsevier Patient Education  2020 [employer]  

## 2020-04-12 NOTE — MAU Note (Cosign Needed Addendum)
Chief Complaint:  Abdominal Pain    HPI: Alicia Black is a 19 y.o. G2P0010 at 65w6dwho presents to maternity admissions reporting a sharp, right lower lateral pain that started yesterday at 4pm and returned at 6pm today. She describes the pain as more of a stitch in her side than crampy pain and states that it does not radiate or move around. The pain is worse with voiding and walking. She denies any strenuous movement prior to onset and states that it is not associated with meals. She states that it is different than the pain that brought her in previously.   She reports good fetal movement, denies LOF, vaginal bleeding, vaginal itching/burning, urinary symptoms, h/a, dizziness, n/v, constipation, hematuria, and fever/chills.  Addendum: Patient reports pain that she first noted about two weeks ago on both mid-sides of her abdomen. The pain recurred yesterday only on her right side. Reports it is intermittent and worse with movement. When she used the restroom, she felt some pain on her right side but denies dysuria. Currently she has no pain. She has not taken anything for pain. Denies nausea, vomiting. She has still been eating normally. Denies back pain.    Past Medical History: Past Medical History:  Diagnosis Date  . Medical history non-contributory   . Sickle cell trait (HCC)     Past obstetric history: OB History  Gravida Para Term Preterm AB Living  2 0     1 0  SAB TAB Ectopic Multiple Live Births  1            # Outcome Date GA Lbr Len/2nd Weight Sex Delivery Anes PTL Lv  2 Current           1 SAB 10/2019 [redacted]w[redacted]d         Past Surgical History: Past Surgical History:  Procedure Laterality Date  . NO PAST SURGERIES      Family History: Family History  Problem Relation Age of Onset  . Asthma Father   . Asthma Sister     Social History: Social History   Tobacco Use  . Smoking status: Former Smoker    Types: Cigars  . Smokeless tobacco: Never Used  Substance Use  Topics  . Alcohol use: Never  . Drug use: Not Currently    Types: Marijuana    Allergies: No Known Allergies  Meds:  Medications Prior to Admission  Medication Sig Dispense Refill Last Dose  . aspirin EC 81 MG tablet Take 1 tablet (81 mg total) by mouth daily. 30 tablet 5 Past Week at Unknown time  . prenatal vitamin w/FE, FA (NATACHEW) 29-1 MG CHEW chewable tablet Chew 1 tablet by mouth daily at 12 noon.   04/11/2020 at Unknown time  . Blood Pressure Monitoring (BLOOD PRESSURE CUFF) MISC 1 kit by Does not apply route once a week. (Patient not taking: Reported on 03/20/2020) 1 each 0 Unknown at Unknown time  . Blood Pressure Monitoring (BLOOD PRESSURE CUFF) MISC 1 Device by Does not apply route once a week. 1 each 0 Unknown at Unknown time  . Elastic Bandages & Supports (COMFORT FIT MATERNITY SUPP LG) MISC 1 each by Does not apply route daily as needed. 1 each 0 Unknown at Unknown time    ROS:  Review of Systems  Constitutional: Negative for appetite change and fever.  HENT: Negative.   Eyes: Negative.   Respiratory: Negative for cough and shortness of breath.   Cardiovascular: Negative for chest pain, palpitations and  leg swelling.  Gastrointestinal: Positive for abdominal pain. Negative for blood in stool, constipation, nausea and vomiting.  Genitourinary: Negative for hematuria.  Musculoskeletal: Negative.   Neurological: Negative for dizziness and headaches.     I have reviewed patient's Past Medical Hx, Surgical Hx, Family Hx, Social Hx, medications and allergies.   Physical Exam   Patient Vitals for the past 24 hrs:  BP Temp Temp src Pulse Resp SpO2 Height Weight  04/12/20 2040 118/60 (!) 97.4 F (36.3 C) Axillary -- 17 -- -- --  04/12/20 1858 126/63 97.6 F (36.4 C) Oral 77 16 100 % 5' 7"  (1.702 m) 71.4 kg   Constitutional: Well-developed, well-nourished female in no acute distress.  Cardiovascular: normal rate Respiratory: normal effort GI: Abd soft, gravid  appropriate for gestational age. +pain with palpation to RLQ but no rebound.  MS: Extremities nontender, no edema, normal ROM Neurologic: Alert and oriented x 4.  GU: Neg CVAT.   FHT:  Baseline 150bpm, moderate variability, accelerations present, no decelerations TOCO: mild uterine irritability (patient not feeling ctx, cramping or pain) Abdomen: Soft, non-tender, no pain with palpation. Rovsing, Murphy's negative. Psoas and Obturator signs negative.    Labs: Results for orders placed or performed during the hospital encounter of 04/12/20 (from the past 24 hour(s))  Urinalysis, Routine w reflex microscopic     Status: Abnormal   Collection Time: 04/12/20  7:24 PM  Result Value Ref Range   Color, Urine STRAW (A) YELLOW   APPearance CLEAR CLEAR   Specific Gravity, Urine 1.009 1.005 - 1.030   pH 7.0 5.0 - 8.0   Glucose, UA NEGATIVE NEGATIVE mg/dL   Hgb urine dipstick NEGATIVE NEGATIVE   Bilirubin Urine NEGATIVE NEGATIVE   Ketones, ur NEGATIVE NEGATIVE mg/dL   Protein, ur NEGATIVE NEGATIVE mg/dL   Nitrite NEGATIVE NEGATIVE   Leukocytes,Ua SMALL (A) NEGATIVE   RBC / HPF 0-5 0 - 5 RBC/hpf   WBC, UA 6-10 0 - 5 WBC/hpf   Bacteria, UA RARE (A) NONE SEEN   Squamous Epithelial / LPF 0-5 0 - 5   O/Positive/-- (02/16 1527)  Imaging:  [place] MFM OB Transvaginal  Result Date: 03/19/2020 ----------------------------------------------------------------------  OBSTETRICS REPORT                       (Signed Final 03/19/2020 06:47 pm) ---------------------------------------------------------------------- Patient Info  ID #:       [id number]                          D.O.B.:  September 07, 2001 (18 yrs)  Name:       Alicia Black                   Visit Date: 03/19/2020 12:31 pm ---------------------------------------------------------------------- Performed By  Performed By:     Wilnette Kales        Ref. Address:      [place]  [place]  Attending:        Johnell Comings MD         [facility]  Referred By:      Elvera Maria CNM ---------------------------------------------------------------------- Orders   #  Description                          Code         Ordered By   1  [place] MFM OB TRANSVAGINAL               [phone]      JENNIFER [facility]  ----------------------------------------------------------------------   #  Order #                    Accession #                 Episode #   1  [id number]                  [id number]                  [id number]  ---------------------------------------------------------------------- Indications   Encounter for cervical length                  Z36.86   History of sickle cell trait                   Z86.2   Abdominal pain in pregnancy                    O99.89   Low Risk NIPS   [redacted] weeks gestation of pregnancy                Z3A.20  ---------------------------------------------------------------------- Fetal Evaluation  Num Of Fetuses:          1  Fetal Heart Rate(bpm):   157  Cardiac Activity:        Observed  Presentation:            Cephalic  Placenta:                Right fundo-lateral  P. Cord Insertion:       Visualized, central  Amniotic Fluid  AFI FV:      Within normal limits                              Largest Pocket(cm)                              4.25  Comment:    No placental abruption or previa identified. ---------------------------------------------------------------------- OB History  Gravidity:    2          SAB:   1 ---------------------------------------------------------------------- Gestational Age  LMP:  28w 6d        Date:  08/30/19                 EDD:   06/05/20  Best:          [name] 5d     Det. ByLoman Chroman         EDD:   08/01/20                                       (01/10/20) ---------------------------------------------------------------------- Anatomy  Thoracic:              Appears normal         Abdomen:                Appears normal  Heart:                 Echogenic focus        Abdominal Wall:         Appears nml (cord                         in LV                                                                        insert, abd wall)  RVOT:                  Appears normal         Cord Vessels:           Appears normal (3                                                                        vessel cord)  LVOT:                  Appears normal         Kidneys:                Appear normal  Ductal Arch:           Appears normal         Bladder:                Appears normal  Stomach:               Appears normal, left                         sided ---------------------------------------------------------------------- Cervix Uterus Adnexa  Cervix  Length:           3.32  cm.  Normal appearance by transvaginal scan  Uterus  No abnormality visualized.  Cul De Sac  No free fluid seen. ---------------------------------------------------------------------- Comments  A transvaginal ultrasound performed today showed a cervical  length of 3.32 cm long without any  signs of funneling.  The fetus is in the vertex presentation.  There was normal amniotic fluid noted. ----------------------------------------------------------------------                   Johnell Comings, MD Electronically Signed Final Report   03/19/2020 06:47 pm ----------------------------------------------------------------------  [place] MFM OB FOLLOW UP  Result Date: 04/07/2020 ----------------------------------------------------------------------  OBSTETRICS REPORT                       (Signed Final 04/07/2020 11:04 am) ---------------------------------------------------------------------- Patient Info  ID #:       [id number]                          D.O.B.:  09-28-2001 (18 yrs)  Name:       Alicia Black                    Visit Date: 04/07/2020 08:35 am ---------------------------------------------------------------------- Performed By  Attending:        Johnell Comings MD         Ref. Address:     [place]  Performed By:     Rodrigo Ran BS      [facility]  Referred By:      Kathie Dike [place] ---------------------------------------------------------------------- Orders  #  Description                           Code        Ordered By  1  [place] MFM OB FOLLOW UP                   [phone]    Peterson Ao ----------------------------------------------------------------------  #  Order #                     Accession #                Episode #  1  [id number]                   [id number]                 [id number] ---------------------------------------------------------------------- Indications  Antenatal follow-up  for nonvisualized fetal    Z36.2  anatomy  [redacted] weeks gestation of pregnancy                Z3A.23  History of sickle cell trait                   Z86.2  Abdominal pain in pregnancy                    O99.89  Low Risk NIPS ---------------------------------------------------------------------- Fetal Evaluation  Num Of Fetuses:         1  Fetal Heart Rate(bpm):  159  Cardiac Activity:       Observed  Presentation:           Variable  Placenta:               Posterior  P. Cord Insertion:      Visualized  Amniotic Fluid  AFI FV:      Within normal limits                              Largest Pocket(cm)                              3.9 ---------------------------------------------------------------------- Biometry  BPD:        52  mm     G. Age:  21w 5d        3.5  %    CI:        68.11   %    70 - 86                                                          FL/HC:      18.9   %     19.2 - 20.8  HC:      201.5  mm     G. Age:  22w 2d          5  %    HC/AC:      1.07        1.05 - 1.21  AC:      188.6  mm     G. Age:  23w 4d         48  %    FL/BPD:     73.3   %    71 - 87  FL:       38.1  mm     G. Age:  22w 1d          8  %    FL/AC:      20.2   %    20 - 24  HUM:      36.7  mm     G. Age:  22w 6d         29  %  CER:      24.8  mm     G. Age:  22w 6d         39  %  Est. FW:     542  gm      1 lb 3 oz     19  % ---------------------------------------------------------------------- OB History  Gravidity:    2          SAB:   1 ---------------------------------------------------------------------- Gestational Age  LMP:           31w 4d        Date:  08/30/19                 EDD:   06/05/20  U/S Today:     22w 3d                                        EDD:   08/08/20  Best:          23w 3d     Det. ByLoman Chroman         EDD:   08/01/20                                      (01/10/20) ---------------------------------------------------------------------- Anatomy  Cranium:               Appears normal         LVOT:                   Appears normal  Cavum:                 Appears normal         Aortic Arch:            Appears normal  Ventricles:            Appears normal         Ductal Arch:            Appears normal  Choroid Plexus:        Appears normal         Diaphragm:              Appears normal  Cerebellum:            Appears normal         Stomach:                Appears normal, left                                                                        sided  Posterior Fossa:       Appears normal         Abdomen:                Appears normal  Nuchal Fold:           Not applicable (>46    Abdominal Wall:         Appears nml (cord                         wks GA)  insert, abd wall)  Face:                  Appears normal         Cord Vessels:           Appears normal (3                         (orbits and profile)                           vessel  cord)  Lips:                  Appears normal         Kidneys:                Appear normal  Palate:                Appears normal         Bladder:                Appears normal  Thoracic:              Appears normal         Spine:                  Appears normal  Heart:                 Appears normal         Upper Extremities:      Previously seen                         (4CH, axis, and                         situs)  RVOT:                  Appears normal         Lower Extremities:      Previously seen  Other:  Heels and 5th digits visualized.  Technically difficult due to fetal          position. ---------------------------------------------------------------------- Cervix Uterus Adnexa  Uterus  No abnormality visualized.  Right Ovary  Within normal limits.  Left Ovary  Within normal limits.  Cul De Sac  No free fluid seen.  Adnexa  No abnormality visualized. ---------------------------------------------------------------------- Comments  This patient was seen for a follow up exam as the fetal  cardiac views were unable to be fully visualized during her  prior exam.  She denies any problems since her last exam.  She was informed that the fetal growth and amniotic fluid  level appears appropriate for her gestational age.  The views of the fetal heart were visualized today.  There  were no obvious anomalies suspected.  The limitations of  ultrasound in the detection of all anomalies was discussed.  As the overall EFW measures in the lower normal range  today, a follow-up growth scan was scheduled in 4 weeks. ----------------------------------------------------------------------                   Johnell Comings, MD Electronically Signed Final Report   04/07/2020 11:04 am ----------------------------------------------------------------------   MAU Course/MDM: Orders Placed This Encounter  Procedures  . OB Urine Culture  . Urinalysis, Routine w reflex microscopic  . Discharge patient  No orders of the defined  types were placed in this encounter.   Assessment: 1. Abdominal pain during pregnancy in second trimester    Patient presented with right mid-quadrant abdominal pain in second trimester. Pregnancy has been uncomplicated. She is being followed for serial growth currently. Previously with [facility] and intermittent bleeding but no bleeding recently. No [place] on last [place]. Previously diagnosed with round ligament pain. Pain was some yesterday and today; intermittently. She does not have any pain currently and denied any pain medication. Denied discharge or concerns for STD. Most important diagnosis to rule-out would be appendicitis or ovarian torsion. As patient does not have any pain currently and has had this pain intermittently for last 2 week; low suspicion. Furthermore, she is currently tolerating a regular diet without nausea and vomiting. Abdomen is soft and non-tender even to deep palpation. No fever, chills.  UA with small leuks; sent for reflex cx and will f/u. Message sent to clinic to f/u on maternity support belt as she reports it was not at her pharmacy. Advised stretching and plenty of fluids.  NST reviewed: Reactive as stated above. TOCO with mild irritability but patient not reporting any cramping/ctx. Patient verbalized understanding of discharge and follow-up instructions and was ambulating without assistance upon discharge.  Future Appointments  Date Time Provider [place]  05/05/2020  2:45 PM [facility] NURSE [facility]  05/05/2020  2:45 PM [facility] US5 [facility]  05/08/2020  8:15 AM [facility] LAB [facility] None  05/08/2020  8:30 AM Chancy Milroy, MD [place] None  05/08/2020  9:15 AM Lynnea Ferrier, LCSW [facility] None   Plan: Discharge home Labor precautions    Nile Dear, [place] 04/12/2020 8:00 PM  I saw and evaluated the patient. I agree with the findings and the plan of care as documented in the student's note. Additions/edits in blue as above.   Barrington Ellison, MD [facility] Family  Medicine Fellow, [facility] for [employer], [place]

## 2020-04-13 ENCOUNTER — Telehealth: Payer: Self-pay

## 2020-04-13 NOTE — Telephone Encounter (Signed)
-----   Message from Joselyn Arrow, MD sent at 04/12/2020  8:35 PM EDT ----- Regarding: Maternity belt Hello,  I see Alicia Black ordered a maternity belt for this patient but she says it's not at her pharmacy. Can we follow-up and try to get her one?  Thanks!  [place]

## 2020-04-13 NOTE — Telephone Encounter (Signed)
Followed up with pt to see if she still has the paper order for the maternity belt and she does. Reminded pt to take the order to [facility] on [address] not her original pharmacy. Pt agrees.

## 2020-04-14 LAB — CULTURE, OB URINE: Culture: <10000 — AB

## 2020-05-02 ENCOUNTER — Ambulatory Visit: Payer: Medicaid Other | Admitting: Licensed Clinical Social Worker

## 2020-05-02 ENCOUNTER — Encounter: Payer: Medicaid Other | Admitting: Obstetrics & Gynecology

## 2020-05-05 ENCOUNTER — Other Ambulatory Visit: Payer: Self-pay

## 2020-05-05 ENCOUNTER — Ambulatory Visit: Payer: Medicaid Other | Attending: Obstetrics and Gynecology

## 2020-05-05 ENCOUNTER — Ambulatory Visit: Payer: Medicaid Other | Admitting: *Deleted

## 2020-05-05 VITALS — BP 124/68 | HR 73

## 2020-05-05 DIAGNOSIS — Z3A27 27 weeks gestation of pregnancy: Secondary | ICD-10-CM

## 2020-05-05 DIAGNOSIS — O99019 Anemia complicating pregnancy, unspecified trimester: Secondary | ICD-10-CM

## 2020-05-05 DIAGNOSIS — O26842 Uterine size-date discrepancy, second trimester: Secondary | ICD-10-CM | POA: Diagnosis not present

## 2020-05-05 DIAGNOSIS — Z362 Encounter for other antenatal screening follow-up: Secondary | ICD-10-CM | POA: Diagnosis not present

## 2020-05-05 DIAGNOSIS — D573 Sickle-cell trait: Secondary | ICD-10-CM | POA: Insufficient documentation

## 2020-05-05 DIAGNOSIS — O365920 Maternal care for other known or suspected poor fetal growth, second trimester, not applicable or unspecified: Secondary | ICD-10-CM | POA: Diagnosis not present

## 2020-05-08 ENCOUNTER — Other Ambulatory Visit: Payer: Medicaid Other

## 2020-05-08 ENCOUNTER — Ambulatory Visit (INDEPENDENT_AMBULATORY_CARE_PROVIDER_SITE_OTHER): Payer: Medicaid Other | Admitting: Licensed Clinical Social Worker

## 2020-05-08 ENCOUNTER — Other Ambulatory Visit: Payer: Self-pay | Admitting: *Deleted

## 2020-05-08 ENCOUNTER — Encounter: Payer: Self-pay | Admitting: Obstetrics and Gynecology

## 2020-05-08 ENCOUNTER — Other Ambulatory Visit: Payer: Self-pay

## 2020-05-08 ENCOUNTER — Ambulatory Visit (INDEPENDENT_AMBULATORY_CARE_PROVIDER_SITE_OTHER): Payer: Medicaid Other | Admitting: Obstetrics and Gynecology

## 2020-05-08 VITALS — BP 124/80 | HR 71 | Wt 167.0 lb

## 2020-05-08 DIAGNOSIS — O99012 Anemia complicating pregnancy, second trimester: Secondary | ICD-10-CM

## 2020-05-08 DIAGNOSIS — Z658 Other specified problems related to psychosocial circumstances: Secondary | ICD-10-CM

## 2020-05-08 DIAGNOSIS — Z3480 Encounter for supervision of other normal pregnancy, unspecified trimester: Secondary | ICD-10-CM | POA: Diagnosis not present

## 2020-05-08 DIAGNOSIS — Z3A27 27 weeks gestation of pregnancy: Secondary | ICD-10-CM

## 2020-05-08 DIAGNOSIS — D573 Sickle-cell trait: Secondary | ICD-10-CM

## 2020-05-08 NOTE — Progress Notes (Signed)
Subjective:  Alicia Black is a 19 y.o. G2P0010 at [redacted]w[redacted]d being seen today for ongoing prenatal care.  She is currently monitored for the following issues for this low-risk pregnancy and has Encounter for supervision of normal pregnancy, antepartum and Sickle cell trait (HCC) on their problem list.  Patient reports carpal tunnel symptoms.  Contractions: Not present. Vag. Bleeding: None.  Movement: Present. Denies leaking of fluid.   The following portions of the patient's history were reviewed and updated as appropriate: allergies, current medications, past family history, past medical history, past social history, past surgical history and problem list. Problem list updated.  Objective:   Vitals:   05/08/20 0834  BP: 124/80  Pulse: 71  Weight: 167 lb (75.8 kg)    Fetal Status: Fetal Heart Rate (bpm): 161   Movement: Present     General:  Alert, oriented and cooperative. Patient is in no acute distress.  Skin: Skin is warm and dry. No rash noted.   Cardiovascular: Normal heart rate noted  Respiratory: Normal respiratory effort, no problems with respiration noted  Abdomen: Soft, gravid, appropriate for gestational age. Pain/Pressure: Present     Pelvic:  Cervical exam deferred        Extremities: Normal range of motion.  Edema: None  Mental Status: Normal mood and affect. Normal behavior. Normal judgment and thought content.   Urinalysis:      Assessment and Plan:  Pregnancy: G2P0010 at [redacted]w[redacted]d  1. Supervision of other normal pregnancy, antepartum Stable Wrist splints for CTS - Glucose Tolerance, 2 Hours w/1 Hour - HIV antibody (with reflex) - CBC - RPR  2. Sickle cell trait (HCC) Last UC negative  Preterm labor symptoms and general obstetric precautions including but not limited to vaginal bleeding, contractions, leaking of fluid and fetal movement were reviewed in detail with the patient. Please refer to After Visit Summary for other counseling recommendations.  Return in  about 3 weeks (around 05/29/2020) for virtual.   Hermina Staggers, MD

## 2020-05-08 NOTE — Patient Instructions (Signed)
Third Trimester of Pregnancy The third trimester is from week 28 through week 40 (months 7 through 9). The third trimester is a time when the unborn baby (fetus) is growing rapidly. At the end of the ninth month, the fetus is about 20 inches in length and weighs 6-10 pounds. Body changes during your third trimester Your body will continue to go through many changes during pregnancy. The changes vary from woman to woman. During the third trimester:  Your weight will continue to increase. You can expect to gain 25-35 pounds (11-16 kg) by the end of the pregnancy.  You may begin to get stretch marks on your hips, abdomen, and breasts.  You may urinate more often because the fetus is moving lower into your pelvis and pressing on your bladder.  You may develop or continue to have heartburn. This is caused by increased hormones that slow down muscles in the digestive tract.  You may develop or continue to have constipation because increased hormones slow digestion and cause the muscles that push waste through your intestines to relax.  You may develop hemorrhoids. These are swollen veins (varicose veins) in the rectum that can itch or be painful.  You may develop swollen, bulging veins (varicose veins) in your legs.  You may have increased body aches in the pelvis, back, or thighs. This is due to weight gain and increased hormones that are relaxing your joints.  You may have changes in your hair. These can include thickening of your hair, rapid growth, and changes in texture. Some women also have hair loss during or after pregnancy, or hair that feels dry or thin. Your hair will most likely return to normal after your baby is born.  Your breasts will continue to grow and they will continue to become tender. A yellow fluid (colostrum) may leak from your breasts. This is the first milk you are producing for your baby.  Your belly button may stick out.  You may notice more swelling in your hands,  face, or ankles.  You may have increased tingling or numbness in your hands, arms, and legs. The skin on your belly may also feel numb.  You may feel short of breath because of your expanding uterus.  You may have more problems sleeping. This can be caused by the size of your belly, increased need to urinate, and an increase in your body's metabolism.  You may notice the fetus "dropping," or moving lower in your abdomen (lightening).  You may have increased vaginal discharge.  You may notice your joints feel loose and you may have pain around your pelvic bone. What to expect at prenatal visits You will have prenatal exams every 2 weeks until week 36. Then you will have weekly prenatal exams. During a routine prenatal visit:  You will be weighed to make sure you and the baby are growing normally.  Your blood pressure will be taken.  Your abdomen will be measured to track your baby's growth.  The fetal heartbeat will be listened to.  Any test results from the previous visit will be discussed.  You may have a cervical check near your due date to see if your cervix has softened or thinned (effaced).  You will be tested for Group B streptococcus. This happens between 35 and 37 weeks. Your health care provider may ask you:  What your birth plan is.  How you are feeling.  If you are feeling the baby move.  If you have had any abnormal   symptoms, such as leaking fluid, bleeding, severe headaches, or abdominal cramping.  If you are using any tobacco products, including cigarettes, chewing tobacco, and electronic cigarettes.  If you have any questions. Other tests or screenings that may be performed during your third trimester include:  Blood tests that check for low iron levels (anemia).  Fetal testing to check the health, activity level, and growth of the fetus. Testing is done if you have certain medical conditions or if there are problems during the pregnancy.  Nonstress test  (NST). This test checks the health of your baby to make sure there are no signs of problems, such as the baby not getting enough oxygen. During this test, a belt is placed around your belly. The baby is made to move, and its heart rate is monitored during movement. What is false labor? False labor is a condition in which you feel small, irregular tightenings of the muscles in the womb (contractions) that usually go away with rest, changing position, or drinking water. These are called Braxton Hicks contractions. Contractions may last for hours, days, or even weeks before true labor sets in. If contractions come at regular intervals, become more frequent, increase in intensity, or become painful, you should see your health care provider. What are the signs of labor?  Abdominal cramps.  Regular contractions that start at 10 minutes apart and become stronger and more frequent with time.  Contractions that start on the top of the uterus and spread down to the lower abdomen and back.  Increased pelvic pressure and dull back pain.  A watery or bloody mucus discharge that comes from the vagina.  Leaking of amniotic fluid. This is also known as your "water breaking." It could be a slow trickle or a gush. Let your health care provider know if it has a color or strange odor. If you have any of these signs, call your health care provider right away, even if it is before your due date. Follow these instructions at home: Medicines  Follow your health care provider's instructions regarding medicine use. Specific medicines may be either safe or unsafe to take during pregnancy.  Take a prenatal vitamin that contains at least 600 micrograms (mcg) of folic acid.  If you develop constipation, try taking a stool softener if your health care provider approves. Eating and drinking   Eat a balanced diet that includes fresh fruits and vegetables, whole grains, good sources of protein such as meat, eggs, or tofu,  and low-fat dairy. Your health care provider will help you determine the amount of weight gain that is right for you.  Avoid raw meat and uncooked cheese. These carry germs that can cause birth defects in the baby.  If you have low calcium intake from food, talk to your health care provider about whether you should take a daily calcium supplement.  Eat four or five small meals rather than three large meals a day.  Limit foods that are high in fat and processed sugars, such as fried and sweet foods.  To prevent constipation: ? Drink enough fluid to keep your urine clear or pale yellow. ? Eat foods that are high in fiber, such as fresh fruits and vegetables, whole grains, and beans. Activity  Exercise only as directed by your health care provider. Most women can continue their usual exercise routine during pregnancy. Try to exercise for 30 minutes at least 5 days a week. Stop exercising if you experience uterine contractions.  Avoid heavy lifting.  Do   not exercise in extreme heat or humidity, or at high altitudes.  Wear low-heel, comfortable shoes.  Practice good posture.  You may continue to have sex unless your health care provider tells you otherwise. Relieving pain and discomfort  Take frequent breaks and rest with your legs elevated if you have leg cramps or low back pain.  Take warm sitz baths to soothe any pain or discomfort caused by hemorrhoids. Use hemorrhoid cream if your health care provider approves.  Wear a good support bra to prevent discomfort from breast tenderness.  If you develop varicose veins: ? Wear support pantyhose or compression stockings as told by your healthcare provider. ? Elevate your feet for 15 minutes, 3-4 times a day. Prenatal care  Write down your questions. Take them to your prenatal visits.  Keep all your prenatal visits as told by your health care provider. This is important. Safety  Wear your seat belt at all times when driving.  Make  a list of emergency phone numbers, including numbers for family, friends, the hospital, and police and fire departments. General instructions  Avoid cat litter boxes and soil used by cats. These carry germs that can cause birth defects in the baby. If you have a cat, ask someone to clean the litter box for you.  Do not travel far distances unless it is absolutely necessary and only with the approval of your health care provider.  Do not use hot tubs, steam rooms, or saunas.  Do not drink alcohol.  Do not use any products that contain nicotine or tobacco, such as cigarettes and e-cigarettes. If you need help quitting, ask your health care provider.  Do not use any medicinal herbs or unprescribed drugs. These chemicals affect the formation and growth of the baby.  Do not douche or use tampons or scented sanitary pads.  Do not cross your legs for long periods of time.  To prepare for the arrival of your baby: ? Take prenatal classes to understand, practice, and ask questions about labor and delivery. ? Make a trial run to the hospital. ? Visit the hospital and tour the maternity area. ? Arrange for maternity or paternity leave through employers. ? Arrange for family and friends to take care of pets while you are in the hospital. ? Purchase a rear-facing car seat and make sure you know how to install it in your car. ? Pack your hospital bag. ? Prepare the baby's nursery. Make sure to remove all pillows and stuffed animals from the baby's crib to prevent suffocation.  Visit your dentist if you have not gone during your pregnancy. Use a soft toothbrush to brush your teeth and be gentle when you floss. Contact a health care provider if:  You are unsure if you are in labor or if your water has broken.  You become dizzy.  You have mild pelvic cramps, pelvic pressure, or nagging pain in your abdominal area.  You have lower back pain.  You have persistent nausea, vomiting, or  diarrhea.  You have an unusual or bad smelling vaginal discharge.  You have pain when you urinate. Get help right away if:  Your water breaks before 37 weeks.  You have regular contractions less than 5 minutes apart before 37 weeks.  You have a fever.  You are leaking fluid from your vagina.  You have spotting or bleeding from your vagina.  You have severe abdominal pain or cramping.  You have rapid weight loss or weight gain.  You have   shortness of breath with chest pain.  You notice sudden or extreme swelling of your face, hands, ankles, feet, or legs.  Your baby makes fewer than 10 movements in 2 hours.  You have severe headaches that do not go away when you take medicine.  You have vision changes. Summary  The third trimester is from week 28 through week 40, months 7 through 9. The third trimester is a time when the unborn baby (fetus) is growing rapidly.  During the third trimester, your discomfort may increase as you and your baby continue to gain weight. You may have abdominal, leg, and back pain, sleeping problems, and an increased need to urinate.  During the third trimester your breasts will keep growing and they will continue to become tender. A yellow fluid (colostrum) may leak from your breasts. This is the first milk you are producing for your baby.  False labor is a condition in which you feel small, irregular tightenings of the muscles in the womb (contractions) that eventually go away. These are called Braxton Hicks contractions. Contractions may last for hours, days, or even weeks before true labor sets in.  Signs of labor can include: abdominal cramps; regular contractions that start at 10 minutes apart and become stronger and more frequent with time; watery or bloody mucus discharge that comes from the vagina; increased pelvic pressure and dull back pain; and leaking of amniotic fluid. This information is not intended to replace advice given to you by your  health care provider. Make sure you discuss any questions you have with your health care provider. Document Revised: 03/11/2019 Document Reviewed: 12/24/2016 Elsevier Patient Education  2020 [employer]vier Inc.  

## 2020-05-08 NOTE — BH Specialist Note (Signed)
Integrated Behavioral Health Follow Up Visit  MRN: [id number] Name: Alicia Black  Number of Integrated Behavioral Health Clinician visits: 2 Session Start time: 9:20am  Session End time: 10:03am, Total time: 43 mins via face to face at [facility]   Type of Service: Integrated Behavioral Health- Individual Interpretor:no  Interpretor Name and Language: none  SUBJECTIVE: Alicia Black is a 19 y.o. female accompanied by n/a Patient was referred by [facility]  for social issues. Patient reports the following symptoms/concerns:  Duration of problem: pregnancy; Severity of problem: mild   OBJECTIVE: Mood: good and Affect:  Risk of harm to self or others: No risk of harm to self or others.   LIFE CONTEXT: Family and Social: Mother lives in [place], Sister lives in [place]. [name] lives alone in [place]  School/Work: Work at home for [employer]: n/a Life Changes: n/a  GOALS ADDRESSED: Patient will: 1.  Reduce symptoms of: anhedonia, crying  2.  Increase knowledge of diagnosis, identify symptoms and utilize effective coping skills to alleviate symptoms  3.  Demonstrate ability to: self manage symptoms   INTERVENTIONS: Interventions utilized:  Supportive counseling  Standardized Assessments completed:    Integrated Behavioral Health from 05/08/2020 in [facility]  PHQ-9 Total Score  6     GAD 7 : Generalized Anxiety Score 05/08/2020  Nervous, Anxious, on Edge 0  Control/stop worrying 0  Worry too much - different things 0  Trouble relaxing 0  Restless 0  Easily annoyed or irritable 0  Afraid - awful might happen 0  Total GAD 7 Score 0     ASSESSMENT: Patient currently experiencing pyschosocial stressors. [name] reports living alone and does not have emotional or physical support at home. [name] reports she needs assistance obtaining a car seat. [name] reports she loss interest in prior activities and cried a  lot during her first trimester. [name] reports she works from home and does not interact much.   Patient may benefit from integrated behavioral health   PLAN: 1. Follow up with behavioral health clinician on : 4 weeks via mychart  2. Behavioral recommendations: Advise [name] to communicate needs to father of baby, practice mindfulness techniques everyday for 10 mins, increase social interaction to decrease isolation. 3. Referral(s): [facility]  4. "From scale of 1-10, how likely are you to follow plan?":   Gwyndolyn Saxon, LCSW

## 2020-05-08 NOTE — Progress Notes (Signed)
ROB  2 hr GTT   CC: weakness in hands sometimes and back pain.   T-Dap Declined

## 2020-05-09 LAB — GLUCOSE TOLERANCE, 2 HOURS W/ 1HR
Glucose, 1 hour: 53 mg/dL — ABNORMAL LOW (ref 65–179)
Glucose, 2 hour: 48 mg/dL — ABNORMAL LOW (ref 65–152)
Glucose, Fasting: 68 mg/dL (ref 65–91)

## 2020-05-09 LAB — CBC
Hematocrit: 34.8 % (ref 34.0–46.6)
Hemoglobin: 11.4 g/dL (ref 11.1–15.9)
MCH: 31.7 pg (ref 26.6–33.0)
MCHC: 32.8 g/dL (ref 31.5–35.7)
MCV: 97 fL (ref 79–97)
Platelets: 224 10*3/uL (ref 150–450)
RBC: 3.6 x10E6/uL — ABNORMAL LOW (ref 3.77–5.28)
RDW: 11.8 % (ref 11.7–15.4)
WBC: 9.3 10*3/uL (ref 3.4–10.8)

## 2020-05-09 LAB — RPR: RPR Ser Ql: NONREACTIVE

## 2020-05-09 LAB — HIV ANTIBODY (ROUTINE TESTING W REFLEX): HIV Screen 4th Generation wRfx: NONREACTIVE

## 2020-05-14 ENCOUNTER — Encounter (HOSPITAL_COMMUNITY): Payer: Self-pay | Admitting: Family Medicine

## 2020-05-14 ENCOUNTER — Other Ambulatory Visit: Payer: Self-pay

## 2020-05-14 ENCOUNTER — Inpatient Hospital Stay (HOSPITAL_COMMUNITY)
Admission: AD | Admit: 2020-05-14 | Discharge: 2020-05-14 | Disposition: A | Discharge status: Home / Self Care | Payer: Medicaid Other | Attending: Family Medicine | Admitting: Family Medicine

## 2020-05-14 DIAGNOSIS — D573 Sickle-cell trait: Secondary | ICD-10-CM | POA: Diagnosis not present

## 2020-05-14 DIAGNOSIS — Z7982 Long term (current) use of aspirin: Secondary | ICD-10-CM | POA: Diagnosis not present

## 2020-05-14 DIAGNOSIS — R519 Headache, unspecified: Secondary | ICD-10-CM | POA: Diagnosis not present

## 2020-05-14 DIAGNOSIS — O99012 Anemia complicating pregnancy, second trimester: Secondary | ICD-10-CM | POA: Diagnosis not present

## 2020-05-14 DIAGNOSIS — Z3A28 28 weeks gestation of pregnancy: Secondary | ICD-10-CM | POA: Insufficient documentation

## 2020-05-14 DIAGNOSIS — N898 Other specified noninflammatory disorders of vagina: Secondary | ICD-10-CM | POA: Diagnosis not present

## 2020-05-14 DIAGNOSIS — O26893 Other specified pregnancy related conditions, third trimester: Secondary | ICD-10-CM

## 2020-05-14 DIAGNOSIS — Z87891 Personal history of nicotine dependence: Secondary | ICD-10-CM | POA: Diagnosis not present

## 2020-05-14 LAB — URINALYSIS, ROUTINE W REFLEX MICROSCOPIC
Bilirubin Urine: NEGATIVE
Glucose, UA: NEGATIVE mg/dL
Hgb urine dipstick: NEGATIVE
Ketones, ur: NEGATIVE mg/dL
Nitrite: NEGATIVE
Protein, ur: NEGATIVE mg/dL
Specific Gravity, Urine: 1.011 (ref 1.005–1.030)
pH: 6 (ref 5.0–8.0)

## 2020-05-14 LAB — WET PREP, GENITAL
Clue Cells Wet Prep HPF POC: NONE SEEN
Sperm: NONE SEEN
Trich, Wet Prep: NONE SEEN
Yeast Wet Prep HPF POC: NONE SEEN

## 2020-05-14 MED ORDER — ACETAMINOPHEN 500 MG PO TABS
1000.0000 mg | ORAL_TABLET | Freq: Once | ORAL | Status: AC
  Administered 2020-05-14: 1000 mg via ORAL
  Filled 2020-05-14: qty 2

## 2020-05-14 NOTE — MAU Note (Signed)
Alicia Black is a 19 y.o. at [redacted]w[redacted]d here in [facility]U reporting:  +headache Pain score: 7/10 Has not taken anything for the pain. Her mom told her she needed to come in. Ongoing intermittently for 3 days Denies vision changes, epigastric pain, nor increase in swelling.  +vaginal discharge "white and clumpy" Ongoing for past 2 weeks Denies odor   Vitals:   05/14/20 1726  BP: 125/63  Pulse: 90  Resp: 16  Temp: (!) 97.5 F (36.4 C)  SpO2: 100%     +FM; 151 Lab orders placed from triage: ua

## 2020-05-14 NOTE — Discharge Instructions (Signed)
General Headache Without Cause A headache is pain or discomfort felt around the head or neck area. The specific cause of a headache may not be found. There are many causes and types of headaches. A few common ones are:  Tension headaches.  Migraine headaches.  Cluster headaches.  Chronic daily headaches. Follow these instructions at home: Watch your condition for any changes. Let your health care provider know about them. Take these steps to help with your condition: Managing pain      Take over-the-counter and prescription medicines only as told by your health care provider.  Lie down in a dark, quiet room when you have a headache.  If directed, put ice on your head and neck area: ? Put ice in a plastic bag. ? Place a towel between your skin and the bag. ? Leave the ice on for 20 minutes, 2-3 times per day.  If directed, apply heat to the affected area. Use the heat source that your health care provider recommends, such as a moist heat pack or a heating pad. ? Place a towel between your skin and the heat source. ? Leave the heat on for 20-30 minutes. ? Remove the heat if your skin turns bright red. This is especially important if you are unable to feel pain, heat, or cold. You may have a greater risk of getting burned.  Keep lights dim if bright lights bother you or make your headaches worse. Eating and drinking  Eat meals on a regular schedule.  If you drink alcohol: ? Limit how much you use to:  0-1 drink a day for women.  0-2 drinks a day for men. ? Be aware of how much alcohol is in your drink. In the [place] one drink equals one 12 oz bottle of beer (355 mL), one 5 oz glass of wine (148 mL), or one 1 oz glass of hard liquor (44 mL).  Stop drinking caffeine, or decrease the amount of caffeine you drink. General instructions   Keep a headache journal to help find out what may trigger your headaches. For example, write down: ? What you eat and drink. ? How much  sleep you get. ? Any change to your diet or medicines.  Try massage or other relaxation techniques.  Limit stress.  Sit up straight, and do not tense your muscles.  Do not use any products that contain nicotine or tobacco, such as cigarettes, e-cigarettes, and chewing tobacco. If you need help quitting, ask your health care provider.  Exercise regularly as told by your health care provider.  Sleep on a regular schedule. Get 7-9 hours of sleep each night, or the amount recommended by your health care provider.  Keep all follow-up visits as told by your health care provider. This is important. Contact a health care provider if:  Your symptoms are not helped by medicine.  You have a headache that is different from the usual headache.  You have nausea or you vomit.  You have a fever. Get help right away if:  Your headache becomes severe quickly.  Your headache gets worse after moderate to intense physical activity.  You have repeated vomiting.  You have a stiff neck.  You have a loss of vision.  You have problems with speech.  You have pain in the eye or ear.  You have muscular weakness or loss of muscle control.  You lose your balance or have trouble walking.  You feel faint or pass out.  You have confusion.    You have a seizure. Summary  A headache is pain or discomfort felt around the head or neck area.  There are many causes and types of headaches. In some cases, the cause may not be found.  Keep a headache journal to help find out what may trigger your headaches. Watch your condition for any changes. Let your health care provider know about them.  Contact a health care provider if you have a headache that is different from the usual headache, or if your symptoms are not helped by medicine.  Get help right away if your headache becomes severe, you vomit, you have a loss of vision, you lose your balance, or you have a seizure. This information is not  intended to replace advice given to you by your health care provider. Make sure you discuss any questions you have with your health care provider. Document Revised: 06/08/2018 Document Reviewed: 06/08/2018 Elsevier Patient Education  2020 [employer] Inc.  

## 2020-05-14 NOTE — MAU Provider Note (Signed)
Chief Complaint:  Headache and Vaginal Discharge   First Provider Initiated Contact with Patient 05/14/20 1801     HPI: Alicia Black is a 19 y.o. G2P0010 at 80w3dwho presents to maternity admissions reporting headache & vaginal discharge. Reports headache started 2 days ago. Occipital headache that comes & goes. Gradual onset. Nothing makes better or worse. No associated symptoms. Has not treated headache.  Also reports increase in thick white vaginal discharge. No odor. No vaginal itching or irritation.  No other complaints.  Location: head Quality: aching Severity: 7/10 in pain scale Duration: 2 days Timing: comes & goes Modifying factors: none Associated signs and symptoms: none  Pregnancy Course: [facility]  Past Medical History:  Diagnosis Date  . [place]    OB History  Gravida Para Term Preterm AB Living  2 0     1 0  SAB TAB Ectopic Multiple Live Births  1            # Outcome Date GA Lbr Len/2nd Weight Sex Delivery Anes PTL Lv  2 Current           1 SAB 10/2019 540w0d        Past Surgical History:  Procedure Laterality Date  . NO PAST SURGERIES     Family History  Problem Relation Age of Onset  . Asthma Father   . Asthma Sister    Social History   Tobacco Use  . Smoking status: Former Smoker    Types: Cigars  . Smokeless tobacco: Never Used  Vaping Use  . Vaping Use: Former  Substance Use Topics  . Alcohol use: Never  . Drug use: Not Currently    Types: Marijuana   No Known Allergies No medications prior to admission.    I have reviewed patient's Past Medical Hx, Surgical Hx, Family Hx, Social Hx, medications and allergies.   ROS:  Review of Systems  Constitutional: Negative.   Eyes: Negative for photophobia and visual disturbance.  Gastrointestinal: Negative.   Genitourinary: Positive for vaginal discharge. Negative for dysuria, genital sores and vaginal bleeding.  Neurological: Positive for headaches.    Physical Exam    Patient Vitals for the past 24 hrs:  BP Temp Temp src Pulse Resp SpO2  05/14/20 1915 120/70 -- -- 81 -- --  05/14/20 1726 125/63 (!) 97.5 F (36.4 C) Oral 90 16 100 %    Constitutional: Well-developed, well-nourished female in no acute distress.  Cardiovascular: normal rate & rhythm, no murmur Respiratory: normal effort, lung sounds clear throughout GI: Abd soft, non-tender, gravid appropriate for gestational age. Pos BS x 4 MS: Extremities nontender, no edema, normal ROM Neurologic: Alert and oriented x 4.   Fetal Tracing:  Baseline: 155 Variability: moderate Accelerations: 10x10 Decelerations: none  Toco: none    Labs: Results for orders placed or performed during the hospital encounter of 05/14/20 (from the past 24 hour(s))  Urinalysis, Routine w reflex microscopic     Status: Abnormal   Collection Time: 05/14/20  5:37 PM  Result Value Ref Range   Color, Urine YELLOW YELLOW   APPearance HAZY (A) CLEAR   Specific Gravity, Urine 1.011 1.005 - 1.030   pH 6.0 5.0 - 8.0   Glucose, UA NEGATIVE NEGATIVE mg/dL   Hgb urine dipstick NEGATIVE NEGATIVE   Bilirubin Urine NEGATIVE NEGATIVE   Ketones, ur NEGATIVE NEGATIVE mg/dL   Protein, ur NEGATIVE NEGATIVE mg/dL   Nitrite NEGATIVE NEGATIVE   Leukocytes,Ua MODERATE (A) NEGATIVE  RBC / HPF 0-5 0 - 5 RBC/hpf   WBC, UA 6-10 0 - 5 WBC/hpf   Bacteria, UA RARE (A) NONE SEEN   Squamous Epithelial / LPF 11-20 0 - 5   Mucus PRESENT    Hyaline Casts, UA PRESENT   Wet prep, genital     Status: Abnormal   Collection Time: 05/14/20  6:12 PM  Result Value Ref Range   Yeast Wet Prep HPF POC NONE SEEN NONE SEEN   Trich, Wet Prep NONE SEEN NONE SEEN   Clue Cells Wet Prep HPF POC NONE SEEN NONE SEEN   WBC, Wet Prep HPF POC FEW (A) NONE SEEN   Sperm NONE SEEN     Imaging:  No results found.  MAU Course: Orders Placed This Encounter  Procedures  . Wet prep, genital  . Urinalysis, Routine w reflex microscopic  . Discharge  patient   Meds ordered this encounter  Medications  . acetaminophen (TYLENOL) tablet 1,000 mg    MDM: Fetal tracing appropriate for gestation  Wet prep & GC/CT collected. Wet prep negative.   Pt complaining of headache that she has not treated at home. Normotensive.  Tylenol 1 gm po given in MAU. Patient reports resolution of headache. Discussed ways to treat headache at home & reasons to return to MAU.   Assessment: 1. Headache in pregnancy, antepartum, third trimester   2. [redacted] weeks gestation of pregnancy     Plan: Discharge home in stable condition.  Tylenol prn headache Discussed reasons to return to MAU GC/CT pending    Allergies as of 05/14/2020   No Known Allergies     Medication List    TAKE these medications   aspirin EC 81 MG tablet Take 1 tablet (81 mg total) by mouth daily.   Blood Pressure Cuff Misc 1 kit by Does not apply route once a week.   Blood Pressure Cuff Misc 1 Device by Does not apply route once a week.   Comfort Fit Maternity Supp Lg Misc 1 each by Does not apply route daily as needed.   prenatal vitamin w/FE, FA 29-1 MG Chew chewable tablet Chew 1 tablet by mouth daily at 12 noon.       Jorje Guild, NP 05/14/2020 7:26 PM

## 2020-05-15 LAB — GC/CHLAMYDIA PROBE AMP (~~LOC~~) NOT AT ARMC
Chlamydia: NEGATIVE
Comment: NEGATIVE
Comment: NORMAL
Neisseria Gonorrhea: NEGATIVE

## 2020-05-24 ENCOUNTER — Encounter (HOSPITAL_COMMUNITY): Payer: Self-pay | Admitting: Obstetrics and Gynecology

## 2020-05-24 ENCOUNTER — Inpatient Hospital Stay (HOSPITAL_COMMUNITY)
Admission: AD | Admit: 2020-05-24 | Discharge: 2020-05-24 | Disposition: A | Discharge status: Home / Self Care | Payer: Medicaid Other | Attending: Obstetrics and Gynecology | Admitting: Obstetrics and Gynecology

## 2020-05-24 ENCOUNTER — Other Ambulatory Visit: Payer: Self-pay

## 2020-05-24 DIAGNOSIS — R102 Pelvic and perineal pain: Secondary | ICD-10-CM | POA: Diagnosis not present

## 2020-05-24 DIAGNOSIS — O99613 Diseases of the digestive system complicating pregnancy, third trimester: Secondary | ICD-10-CM | POA: Insufficient documentation

## 2020-05-24 DIAGNOSIS — Z3A29 29 weeks gestation of pregnancy: Secondary | ICD-10-CM | POA: Diagnosis not present

## 2020-05-24 DIAGNOSIS — O26893 Other specified pregnancy related conditions, third trimester: Secondary | ICD-10-CM | POA: Diagnosis not present

## 2020-05-24 DIAGNOSIS — K5900 Constipation, unspecified: Secondary | ICD-10-CM | POA: Diagnosis not present

## 2020-05-24 DIAGNOSIS — D573 Sickle-cell trait: Secondary | ICD-10-CM | POA: Diagnosis not present

## 2020-05-24 DIAGNOSIS — O99013 Anemia complicating pregnancy, third trimester: Secondary | ICD-10-CM | POA: Diagnosis not present

## 2020-05-24 DIAGNOSIS — Z87891 Personal history of nicotine dependence: Secondary | ICD-10-CM | POA: Diagnosis not present

## 2020-05-24 DIAGNOSIS — Z7982 Long term (current) use of aspirin: Secondary | ICD-10-CM | POA: Insufficient documentation

## 2020-05-24 LAB — URINALYSIS, ROUTINE W REFLEX MICROSCOPIC
Bilirubin Urine: NEGATIVE
Glucose, UA: NEGATIVE mg/dL
Hgb urine dipstick: NEGATIVE
Ketones, ur: NEGATIVE mg/dL
Leukocytes,Ua: NEGATIVE
Nitrite: NEGATIVE
Protein, ur: NEGATIVE mg/dL
Specific Gravity, Urine: 1.01 (ref 1.005–1.030)
pH: 6 (ref 5.0–8.0)

## 2020-05-24 NOTE — MAU Provider Note (Signed)
Chief Complaint:  Vaginal Pain   First Provider Initiated Contact with Patient 05/24/20 1515     HPI: Alicia Black is a 19 y.o. G2P0010 at 49w6dwho presents to maternity admissions reporting vaginal pain. Symptoms started Saturday during intercourse. Reports constant pain inside of her vagina. Nothing makes better or worse. Hasn't treated symptoms. Denies n/v/d, dysuria, vaginal bleeding, vaginal discharge, or LOF. Has had some issues with constipation. Last BM was this morning.  Good fetal movement.   Location: vagina Quality: cramping Severity: 9/10 in pain scale Duration: 5 days Timing: constant Modifying factors: none Associated signs and symptoms: constipation  Pregnancy Course: [facility]  Past Medical History:  Diagnosis Date  . [place]    OB History  Gravida Para Term Preterm AB Living  2 0     1 0  SAB TAB Ectopic Multiple Live Births  1            # Outcome Date GA Lbr Len/2nd Weight Sex Delivery Anes PTL Lv  2 Current           1 SAB 10/2019 553w0d        Past Surgical History:  Procedure Laterality Date  . NO PAST SURGERIES     Family History  Problem Relation Age of Onset  . Asthma Father   . Asthma Sister    Social History   Tobacco Use  . Smoking status: Former Smoker    Types: Cigars  . Smokeless tobacco: Never Used  Vaping Use  . Vaping Use: Former  Substance Use Topics  . Alcohol use: Never  . Drug use: Not Currently    Types: Marijuana   No Known Allergies Medications Prior to Admission  Medication Sig Dispense Refill Last Dose  . aspirin EC 81 MG tablet Take 1 tablet (81 mg total) by mouth daily. 30 tablet 5 Past Week at Unknown time  . prenatal vitamin w/FE, FA (NATACHEW) 29-1 MG CHEW chewable tablet Chew 1 tablet by mouth daily at 12 noon.   05/24/2020 at Unknown time  . Blood Pressure Monitoring (BLOOD PRESSURE CUFF) MISC 1 kit by Does not apply route once a week. (Patient not taking: Reported on 03/20/2020) 1 each 0    . Blood Pressure Monitoring (BLOOD PRESSURE CUFF) MISC 1 Device by Does not apply route once a week. (Patient not taking: Reported on 05/08/2020) 1 each 0   . Elastic Bandages & Supports (COMFORT FIT MATERNITY SUPP LG) MISC 1 each by Does not apply route daily as needed. (Patient not taking: Reported on 05/08/2020) 1 each 0     I have reviewed patient's Past Medical Hx, Surgical Hx, Family Hx, Social Hx, medications and allergies.   ROS:  Review of Systems  Constitutional: Negative.   Gastrointestinal: Positive for constipation. Negative for abdominal pain, diarrhea, nausea and vomiting.  Genitourinary: Positive for vaginal pain. Negative for dysuria, genital sores, vaginal bleeding and vaginal discharge.    Physical Exam   Patient Vitals for the past 24 hrs:  BP Temp Temp src Pulse Resp SpO2 Weight  05/24/20 1607 133/63 -- -- -- -- -- --  05/24/20 1520 -- -- -- -- -- 100 % --  05/24/20 1515 -- -- -- -- -- 100 % --  05/24/20 1510 -- -- -- -- -- 100 % --  05/24/20 1509 (!) 130/56 -- -- 74 -- -- --  05/24/20 1505 -- -- -- -- -- 100 % --  05/24/20 1451 127/61 98.5 F (36.9  C) Oral 77 16 100 % 79 kg    Constitutional: Well-developed, well-nourished female in no acute distress.  Cardiovascular: normal rate & rhythm, no murmur Respiratory: normal effort, lung sounds clear throughout GI: Abd soft, non-tender, gravid appropriate for gestational age. Pos BS x 4 MS: Extremities nontender, no edema, normal ROM Neurologic: Alert and oriented x 4.  GU:      Pelvic: NEFG, physiologic discharge, no blood, cervix clean.   Dilation: Closed Exam by:: Robyne Askew NP  Fetal Tracing:  Baseline: 150 Variability: moderate Accelerations: 10x10 Decelerations: none  Toco: UI    Labs: Results for orders placed or performed during the hospital encounter of 05/24/20 (from the past 24 hour(s))  Urinalysis, Routine w reflex microscopic     Status: Abnormal   Collection Time: 05/24/20  3:22 PM   Result Value Ref Range   Color, Urine YELLOW YELLOW   APPearance HAZY (A) CLEAR   Specific Gravity, Urine 1.010 1.005 - 1.030   pH 6.0 5.0 - 8.0   Glucose, UA NEGATIVE NEGATIVE mg/dL   Hgb urine dipstick NEGATIVE NEGATIVE   Bilirubin Urine NEGATIVE NEGATIVE   Ketones, ur NEGATIVE NEGATIVE mg/dL   Protein, ur NEGATIVE NEGATIVE mg/dL   Nitrite NEGATIVE NEGATIVE   Leukocytes,Ua NEGATIVE NEGATIVE    Imaging:  No results found.  MAU Course: Orders Placed This Encounter  Procedures  . Urinalysis, Routine w reflex microscopic  . Discharge patient   No orders of the defined types were placed in this encounter.   MDM: UI on monitor. Abdomen soft & non tender. Patient denies any abdominal pain.  Cervix closed/thick  Patient initially reports constant vaginal pain but then states it only hurts when she stands. Has rx for maternity support belt but hadn't picked it up yet. Encouraged to get maternity support belt & start wearing it  U/a without signs of infection Vaginal swabs collected <2 weeks ago. Patient denies any changes in discharge & has not concerns for STD.  Assessment: 1. Pelvic pain affecting pregnancy in third trimester, antepartum   2. [redacted] weeks gestation of pregnancy   3. Constipation during pregnancy in third trimester     Plan: Discharge home in stable condition.  Go to [employer] for maternity support belt Discussed reasons to return to [place]. Follow up.   Why: go for maternity support belt Contact information: [address] [address] [phone]               Allergies as of 05/24/2020   No Known Allergies     Medication List    TAKE these medications   aspirin EC 81 MG tablet Take 1 tablet (81 mg total) by mouth daily.   Blood Pressure Cuff Misc 1 kit by Does not apply route once a week. What changed: Another medication with the same name was removed.  Continue taking this medication, and follow the directions you see here.   Comfort Fit Maternity Supp Lg Misc 1 each by Does not apply route daily as needed.   prenatal vitamin w/FE, FA 29-1 MG Chew chewable tablet Chew 1 tablet by mouth daily at 12 noon.       Jorje Guild, NP 05/24/2020 4:10 PM

## 2020-05-24 NOTE — Discharge Instructions (Signed)

## 2020-05-24 NOTE — MAU Note (Signed)
"  Having a really bad pain in her private area", cramping pain, constant. No bleeding or leaking. Started during intercourse.  Has been constipated too.

## 2020-05-29 ENCOUNTER — Encounter: Payer: Self-pay | Admitting: Obstetrics and Gynecology

## 2020-05-29 ENCOUNTER — Telehealth (INDEPENDENT_AMBULATORY_CARE_PROVIDER_SITE_OTHER): Payer: Medicaid Other | Admitting: Obstetrics and Gynecology

## 2020-05-29 DIAGNOSIS — Z3400 Encounter for supervision of normal first pregnancy, unspecified trimester: Secondary | ICD-10-CM | POA: Diagnosis not present

## 2020-05-29 DIAGNOSIS — D573 Sickle-cell trait: Secondary | ICD-10-CM | POA: Diagnosis not present

## 2020-05-29 NOTE — Progress Notes (Signed)
Pt states she has occ vaginal pain.  Pt does not have BP cuff today- can't take BP.

## 2020-05-29 NOTE — Progress Notes (Signed)
OBSTETRICS PRENATAL VIRTUAL VISIT ENCOUNTER NOTE  Provider location: [facility] for [facility] [place]   I connected with [name] on 05/29/20 at  4:15 PM EDT by MyChart Video Encounter at home and verified that I am speaking with the correct person using two identifiers.   I discussed the limitations, risks, security and privacy concerns of performing an evaluation and management service virtually and the availability of in person appointments. I also discussed with the patient that there may be a patient responsible charge related to this service. The patient expressed understanding and agreed to proceed. Subjective:  Alicia Black is a 19 y.o. G2P0010 at [redacted]w[redacted]d being seen today for ongoing prenatal care.  She is currently monitored for the following issues for this low-risk pregnancy and has Encounter for supervision of normal pregnancy, antepartum and Sickle cell trait (HCC) on their problem list.  Patient reports occasional vaginal pain. She was recently seen in MAU for the same complaints.  Contractions: Irritability. Vag. Bleeding: None.  Movement: Present. Denies any leaking of fluid.   The following portions of the patient's history were reviewed and updated as appropriate: allergies, current medications, past family history, past medical history, past social history, past surgical history and problem list.   Objective:  There were no vitals filed for this visit.  Fetal Status:     Movement: Present     General:  Alert, oriented and cooperative. Patient is in no acute distress.  Respiratory: Normal respiratory effort, no problems with respiration noted  Mental Status: Normal mood and affect. Normal behavior. Normal judgment and thought content.  Rest of physical exam deferred due to type of encounter  Imaging: [place] MFM OB FOLLOW UP  Result Date: 05/05/2020 ----------------------------------------------------------------------  OBSTETRICS REPORT                        (Signed Final 05/05/2020 03:51 pm) ---------------------------------------------------------------------- Patient Info  ID #:       [id number]                          D.O.B.:  May 23, 2001 (19 yrs)  Name:       [facility]                   Visit Date: 05/05/2020 03:30 pm ---------------------------------------------------------------------- Performed By  Attending:        Ma Rings MD         Ref. Address:     [address]                                                             Rd                                                             Jacky Kindle  Performed By:     Marcellina Millin          [facility]  Referred By:      Wilmer FloorLISA A LEFTWICH-                    Craige CottaKIRBY CNM ---------------------------------------------------------------------- Orders  #  Description                           Code        Ordered By  1  US MFM OB FOLLOW UP                   [id number]    YU FANG ----------------------------------------------------------------------  #  Order #                     Accession #                Episode #  1  [id number]                   [id number]                 [id number] ---------------------------------------------------------------------- Indications  Small for gestational age fetus affecting      O36.5990  management of mother  Uterine size-date discrepancy, second          O26.842  trimester  Encounter for other antenatal screening        Z36.2  follow-up  [redacted] weeks gestation of pregnancy                Z3A.27 ---------------------------------------------------------------------- Fetal Evaluation  Num Of Fetuses:         1  Fetal Heart Rate(bpm):  157  Cardiac Activity:       Observed  Presentation:           Cephalic  Placenta:               Posterior  P. Cord Insertion:      Previously Visualized  Amniotic Fluid  AFI FV:      Within normal limits                              Largest Pocket(cm)                               3.5 ---------------------------------------------------------------------- Biometry  BPD:      70.8  mm     G. Age:  28w 3d         72  %    CI:        80.06   %    70 - 86                                                          FL/HC:      19.2   %    18.6 - 20.4  HC:       250   mm     G. Age:  27w 1d  15  %    HC/AC:      1.11        1.05 - 1.21  AC:      224.6  mm     G. Age:  26w 6d         25  %    FL/BPD:     67.7   %    71 - 87  FL:       47.9  mm     G. Age:  26w 0d          6  %    FL/AC:      21.3   %    20 - 24  Est. FW:     971  gm      2 lb 2 oz     15  % ---------------------------------------------------------------------- OB History  Gravidity:    2          SAB:   1 ---------------------------------------------------------------------- Gestational Age  LMP:           35w 4d        Date:  08/30/19                 EDD:   06/05/20  U/S Today:     27w 1d                                        EDD:   08/03/20  Best:          27w 3d     Det. ByLoman Chroman         EDD:   08/01/20                                      (01/10/20) ---------------------------------------------------------------------- Anatomy  Cranium:               Appears normal         LVOT:                   Appears normal  Cavum:                 Previously seen        Aortic Arch:            Appears normal  Ventricles:            Appears normal         Ductal Arch:            Appears normal  Choroid Plexus:        Appears normal         Diaphragm:              Appears normal  Cerebellum:            Appears normal         Stomach:                Appears normal, left  sided  Posterior Fossa:       Previously seen        Abdomen:                Appears normal  Nuchal Fold:           Not applicable (>20    Abdominal Wall:         Previously seen                         wks GA)  Face:                  Orbits and profile     Cord Vessels:            Previously seen                         previously seen  Lips:                  Previously seen        Kidneys:                Appear normal  Palate:                Appears normal         Bladder:                Appears normal  Thoracic:              Appears normal         Spine:                  Appears normal  Heart:                 Previously seen        Upper Extremities:      Previously seen  RVOT:                  Previously seen        Lower Extremities:      Previously seen  Other:  Heels and 5th digits previously visualized.  Technically difficult due to          fetal position. ---------------------------------------------------------------------- Cervix Uterus Adnexa  Cervix  Not visualized (advanced GA >24wks) ---------------------------------------------------------------------- Comments  This patient was seen for a follow up growth scan as a small  for gestational age fetus was noted during her prior  ultrasound exams.  She denies any problems since her last  exam.  She was informed that the fetal growth and amniotic fluid  level appears appropriate for her gestational age.  The overall  EFW continues to measure in the lower normal range (15th  percentile).  A follow up exam was scheduled in 4 weeks. ----------------------------------------------------------------------                   Ma Rings, MD Electronically Signed Final Report   05/05/2020 03:51 pm ----------------------------------------------------------------------   Assessment and Plan:  Pregnancy: G2P0010 at [redacted]w[redacted]d 1. Supervision of normal first pregnancy, antepartum Patient is doing well Follow up growth ultrasound in July Patient has not picked maternity support belt Patient undecided on pediatrician Patient does not want any birth control  2. Sickle cell trait (HCC) Normal urine culture in May  Preterm labor symptoms and general obstetric precautions including but not limited to vaginal bleeding, contractions,  leaking of fluid and  fetal movement were reviewed in detail with the patient. I discussed the assessment and treatment plan with the patient. The patient was provided an opportunity to ask questions and all were answered. The patient agreed with the plan and demonstrated an understanding of the instructions. The patient was advised to call back or seek an in-person office evaluation/go to MAU at [facility] for any urgent or concerning symptoms. Please refer to After Visit Summary for other counseling recommendations.   I provided 15 minutes of face-to-face time during this encounter.  Return in about 2 weeks (around 06/12/2020) for ROB, Low risk.  Future Appointments  Date Time Provider Department Center  06/02/2020  3:15 PM [facility] NURSE [facility]  06/02/2020  3:15 PM [facility]  06/12/2020  4:00 PM Leftwich-Kirby, Wilmer Floor, CNM [facility] None  06/13/2020  2:30 PM Gwyndolyn Saxon, LCSW [facility] None    Catalina Antigua, MD Center for [facility]

## 2020-06-02 ENCOUNTER — Other Ambulatory Visit: Payer: Self-pay | Admitting: Obstetrics

## 2020-06-02 ENCOUNTER — Ambulatory Visit: Payer: Medicaid Other | Attending: Obstetrics

## 2020-06-02 ENCOUNTER — Other Ambulatory Visit: Payer: Self-pay

## 2020-06-02 ENCOUNTER — Ambulatory Visit: Payer: Medicaid Other | Admitting: *Deleted

## 2020-06-02 DIAGNOSIS — O26842 Uterine size-date discrepancy, second trimester: Secondary | ICD-10-CM

## 2020-06-02 DIAGNOSIS — O365931 Maternal care for other known or suspected poor fetal growth, third trimester, fetus 1: Secondary | ICD-10-CM

## 2020-06-02 DIAGNOSIS — Z3A31 31 weeks gestation of pregnancy: Secondary | ICD-10-CM

## 2020-06-02 DIAGNOSIS — O365930 Maternal care for other known or suspected poor fetal growth, third trimester, not applicable or unspecified: Secondary | ICD-10-CM

## 2020-06-02 DIAGNOSIS — Z362 Encounter for other antenatal screening follow-up: Secondary | ICD-10-CM

## 2020-06-06 ENCOUNTER — Other Ambulatory Visit: Payer: Self-pay | Admitting: *Deleted

## 2020-06-06 ENCOUNTER — Encounter: Payer: Medicaid Other | Admitting: Licensed Clinical Social Worker

## 2020-06-06 DIAGNOSIS — O365930 Maternal care for other known or suspected poor fetal growth, third trimester, not applicable or unspecified: Secondary | ICD-10-CM

## 2020-06-07 ENCOUNTER — Ambulatory Visit: Payer: Medicaid Other | Attending: Obstetrics and Gynecology

## 2020-06-07 ENCOUNTER — Ambulatory Visit: Payer: Medicaid Other | Admitting: *Deleted

## 2020-06-07 ENCOUNTER — Other Ambulatory Visit: Payer: Self-pay

## 2020-06-07 DIAGNOSIS — O26843 Uterine size-date discrepancy, third trimester: Secondary | ICD-10-CM

## 2020-06-07 DIAGNOSIS — O365931 Maternal care for other known or suspected poor fetal growth, third trimester, fetus 1: Secondary | ICD-10-CM

## 2020-06-07 DIAGNOSIS — Z362 Encounter for other antenatal screening follow-up: Secondary | ICD-10-CM | POA: Diagnosis not present

## 2020-06-07 DIAGNOSIS — O365930 Maternal care for other known or suspected poor fetal growth, third trimester, not applicable or unspecified: Secondary | ICD-10-CM

## 2020-06-07 DIAGNOSIS — Z3A32 32 weeks gestation of pregnancy: Secondary | ICD-10-CM | POA: Diagnosis not present

## 2020-06-12 ENCOUNTER — Other Ambulatory Visit: Payer: Self-pay

## 2020-06-12 ENCOUNTER — Encounter: Payer: Self-pay | Admitting: Advanced Practice Midwife

## 2020-06-12 ENCOUNTER — Telehealth (INDEPENDENT_AMBULATORY_CARE_PROVIDER_SITE_OTHER): Payer: Medicaid Other | Admitting: Advanced Practice Midwife

## 2020-06-12 VITALS — BP 133/76 | HR 76 | Wt 179.0 lb

## 2020-06-12 DIAGNOSIS — O26893 Other specified pregnancy related conditions, third trimester: Secondary | ICD-10-CM

## 2020-06-12 DIAGNOSIS — Z3400 Encounter for supervision of normal first pregnancy, unspecified trimester: Secondary | ICD-10-CM

## 2020-06-12 DIAGNOSIS — D573 Sickle-cell trait: Secondary | ICD-10-CM

## 2020-06-12 DIAGNOSIS — Z3A32 32 weeks gestation of pregnancy: Secondary | ICD-10-CM

## 2020-06-12 DIAGNOSIS — R519 Headache, unspecified: Secondary | ICD-10-CM

## 2020-06-12 LAB — POCT URINALYSIS DIPSTICK
Bilirubin, UA: NEGATIVE
Blood, UA: NEGATIVE
Glucose, UA: NEGATIVE
Ketones, UA: NEGATIVE
Nitrite, UA: NEGATIVE
Protein, UA: NEGATIVE
Spec Grav, UA: 1.01 (ref 1.010–1.025)
Urobilinogen, UA: 0.2 E.U./dL
pH, UA: 7 (ref 5.0–8.0)

## 2020-06-12 MED ORDER — BUTALBITAL-APAP-CAFFEINE 50-325-40 MG PO TABS: 1.0000 | ORAL_TABLET | Freq: Four times a day (QID) | ORAL | 0 refills | Status: DC | PRN

## 2020-06-12 NOTE — Patient Instructions (Signed)
General Headache Without Cause A headache is pain or discomfort felt around the head or neck area. The specific cause of a headache may not be found. There are many causes and types of headaches. A few common ones are:  Tension headaches.  Migraine headaches.  Cluster headaches.  Chronic daily headaches. Follow these instructions at home: Watch your condition for any changes. Let your health care provider know about them. Take these steps to help with your condition: Managing pain      Take over-the-counter and prescription medicines only as told by your health care provider.  Lie down in a dark, quiet room when you have a headache.  If directed, put ice on your head and neck area: ? Put ice in a plastic bag. ? Place a towel between your skin and the bag. ? Leave the ice on for 20 minutes, 2-3 times per day.  If directed, apply heat to the affected area. Use the heat source that your health care provider recommends, such as a moist heat pack or a heating pad. ? Place a towel between your skin and the heat source. ? Leave the heat on for 20-30 minutes. ? Remove the heat if your skin turns bright red. This is especially important if you are unable to feel pain, heat, or cold. You may have a greater risk of getting burned.  Keep lights dim if bright lights bother you or make your headaches worse. Eating and drinking  Eat meals on a regular schedule.  If you drink alcohol: ? Limit how much you use to:  0-1 drink a day for women.  0-2 drinks a day for men. ? Be aware of how much alcohol is in your drink. In the [place] one drink equals one 12 oz bottle of beer (355 mL), one 5 oz glass of wine (148 mL), or one 1 oz glass of hard liquor (44 mL).  Stop drinking caffeine, or decrease the amount of caffeine you drink. General instructions   Keep a headache journal to help find out what may trigger your headaches. For example, write down: ? What you eat and drink. ? How much  sleep you get. ? Any change to your diet or medicines.  Try massage or other relaxation techniques.  Limit stress.  Sit up straight, and do not tense your muscles.  Do not use any products that contain nicotine or tobacco, such as cigarettes, e-cigarettes, and chewing tobacco. If you need help quitting, ask your health care provider.  Exercise regularly as told by your health care provider.  Sleep on a regular schedule. Get 7-9 hours of sleep each night, or the amount recommended by your health care provider.  Keep all follow-up visits as told by your health care provider. This is important. Contact a health care provider if:  Your symptoms are not helped by medicine.  You have a headache that is different from the usual headache.  You have nausea or you vomit.  You have a fever. Get help right away if:  Your headache becomes severe quickly.  Your headache gets worse after moderate to intense physical activity.  You have repeated vomiting.  You have a stiff neck.  You have a loss of vision.  You have problems with speech.  You have pain in the eye or ear.  You have muscular weakness or loss of muscle control.  You lose your balance or have trouble walking.  You feel faint or pass out.  You have confusion.    You have a seizure. Summary  A headache is pain or discomfort felt around the head or neck area.  There are many causes and types of headaches. In some cases, the cause may not be found.  Keep a headache journal to help find out what may trigger your headaches. Watch your condition for any changes. Let your health care provider know about them.  Contact a health care provider if you have a headache that is different from the usual headache, or if your symptoms are not helped by medicine.  Get help right away if your headache becomes severe, you vomit, you have a loss of vision, you lose your balance, or you have a seizure. This information is not  intended to replace advice given to you by your health care provider. Make sure you discuss any questions you have with your health care provider. Document Revised: 06/08/2018 Document Reviewed: 06/08/2018 Elsevier Patient Education  2020 [employer]  

## 2020-06-12 NOTE — Progress Notes (Signed)
Pt c/o HA's no relief with Tylenol. Next BPP/Doppler scheduled 06/15/20 - [facility]

## 2020-06-12 NOTE — Progress Notes (Signed)
   PRENATAL VISIT NOTE  Subjective:  Alicia Black is a 19 y.o. G2P0010 at [redacted]w[redacted]d being seen today for ongoing prenatal care.  She is currently monitored for the following issues for this low-risk pregnancy and has Encounter for supervision of normal pregnancy, antepartum and Sickle cell trait (HCC) on their problem list.  Patient reports headache.  Contractions: Irritability. Vag. Bleeding: None.  Movement: Present. Denies leaking of fluid.   The following portions of the patient's history were reviewed and updated as appropriate: allergies, current medications, past family history, past medical history, past social history, past surgical history and problem list.   Objective:   Vitals:   06/12/20 1444  BP: 133/76  Pulse: 76  Weight: 179 lb (81.2 kg)    Fetal Status: Fetal Heart Rate (bpm): 155    Movement: Present     General:  Alert, oriented and cooperative. Patient is in no acute distress.  Skin: Skin is warm and dry. No rash noted.   Cardiovascular: Normal heart rate noted  Respiratory: Normal respiratory effort, no problems with respiration noted  Abdomen: Soft, gravid, appropriate for gestational age.  Pain/Pressure: Present     Pelvic: Cervical exam deferred        Extremities: Normal range of motion.  Edema: None  Mental Status: Normal mood and affect. Normal behavior. Normal judgment and thought content. Neuro exam wnl, including reflexes, cranial nerves, PERRLA, and normal gait and strength bilaterally   Assessment and Plan:  Pregnancy: G2P0010 at [redacted]w[redacted]d 1. Supervision of normal first pregnancy, antepartum --Anticipatory guidance about next visits/weeks of pregnancy given. --Next visit in 2 weeks in the office to f/u on symptoms  2. Sickle cell trait (HCC)   3. Headache in pregnancy, antepartum, third trimester --BP wnl today. No hx HTN.  Neuro exam wnl today.   --Pt has [place] on Thursday so will have BP check this week.  Next appt in 2 weeks.  --H/A x 1 month.   Drinking 3-4 bottles of water daily.  Encouraged to drink more water daily.   --Pt took Tylenol in MAU recently and reports it did not help. --Try caffeine and/or Tylenol PRN. Pt reluctant to take medications.  She reports she knew someone who had a loss and she thinks it was caused my medication.  Reviewed safety of medications in pregnancy, no association with fetal or infant loss when taken as prescribed.   Preterm labor symptoms and general obstetric precautions including but not limited to vaginal bleeding, contractions, leaking of fluid and fetal movement were reviewed in detail with the patient. Please refer to After Visit Summary for other counseling recommendations.   No follow-ups on file.  Future Appointments  Date Time Provider Department Center  06/12/2020  4:00 PM Leftwich-Kirby, Wilmer Floor, CNM [facility] None  06/13/2020  2:30 PM Gwyndolyn Saxon, LCSW [facility] None  06/15/2020  3:15 PM [facility] NURSE [facility]  06/15/2020  3:30 PM [facility] US3 [facility]  06/22/2020  3:00 PM [facility]URSE [facility]  06/22/2020  3:15 PM [facility] US3 [facility]    Sharen Counter, CNM

## 2020-06-13 ENCOUNTER — Ambulatory Visit (INDEPENDENT_AMBULATORY_CARE_PROVIDER_SITE_OTHER): Payer: Medicaid Other | Admitting: Licensed Clinical Social Worker

## 2020-06-13 DIAGNOSIS — Z658 Other specified problems related to psychosocial circumstances: Secondary | ICD-10-CM

## 2020-06-13 DIAGNOSIS — Z604 Social exclusion and rejection: Secondary | ICD-10-CM | POA: Diagnosis not present

## 2020-06-13 DIAGNOSIS — R45851 Suicidal ideations: Secondary | ICD-10-CM | POA: Diagnosis not present

## 2020-06-13 DIAGNOSIS — O99340 Other mental disorders complicating pregnancy, unspecified trimester: Secondary | ICD-10-CM

## 2020-06-14 NOTE — BH Specialist Note (Signed)
Integrated Behavioral Health via Telemedicine Video (Caregility) Visit  06/14/2020 Alicia Black [id number]  Number of Integrated Behavioral Health visits: 3 Session Start time: 12:32pm  Session End time: 12:51pm Total time: 19 mins via mychart   Referring Provider: L. Leftwich-Kirby CNM Type of Visit: Video Patient/Family location: Home  [facility] Provider location: [facility]  All persons participating in visit: Pt Ms. Lequita Halt and LCSWA A. Toren Tucholski   Confirmed patient's address: yes Confirmed patient's phone number: yes Any changes to demographics: no  Confirmed patient's insurance: n/a Any changes to patient's insurance: n/a  Discussed confidentiality: yes  I connected with [name] and/or Conception Chancy  by a video enabled telemedicine application (Caregility) and verified that I am speaking with the correct person using two identifiers.     I discussed the limitations of evaluation and management by telemedicine and the availability of in person appointments.  I discussed that the purpose of this visit is to provide behavioral health care while limiting exposure to the novel coronavirus.   Discussed there is a possibility of technology failure and discussed alternative modes of communication if that failure occurs.  I discussed that engaging in this virtual visit, they consent to the provision of behavioral healthcare and the services will be billed under their insurance.  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   GOALS ADDRESSED: Patient will: 1.  Reduce symptoms of: social isolation, anhedonia  2.  Increase knowledge and/or ability  of:  Diagnosis and learn effective coping skills to alleviate symptoms  3.  Demonstrate ability to: self manage symptoms   INTERVENTIONS: Interventions utilized:  Supportive counseling  Standardized Assessments completed: 05/08/2020   Integrated Behavioral Health from 05/08/2020 in [facility]  PHQ-9 Total Score 6      ASSESSMENT: Patient currently experiencing psychosocial stressors. Ms. Patricelli reports FOB is supportive and she has limited support from family. Ms. Reindel improvement in mood and get approx 4 hours of sleep.   Patient may benefit from wraparound community resources   PLAN: 1. Follow up with behavioral health clinician on : as needed  2. Behavioral recommendations: collaborate with community resources to secure wraparound services  3. Referral(s): [facility], [facility]   I discussed the assessment and treatment plan with the patient and/or parent/guardian. They were provided an opportunity to ask questions and all were answered. They agreed with the plan and demonstrated an understanding of the instructions.   They were advised to call back or seek an in-person evaluation if the symptoms worsen or if the condition fails to improve as anticipated.  Gwyndolyn Saxon

## 2020-06-15 ENCOUNTER — Other Ambulatory Visit: Payer: Self-pay | Admitting: *Deleted

## 2020-06-15 ENCOUNTER — Ambulatory Visit: Payer: Medicaid Other | Admitting: *Deleted

## 2020-06-15 ENCOUNTER — Ambulatory Visit: Payer: Medicaid Other | Attending: Obstetrics and Gynecology

## 2020-06-15 ENCOUNTER — Other Ambulatory Visit: Payer: Self-pay

## 2020-06-15 DIAGNOSIS — Z362 Encounter for other antenatal screening follow-up: Secondary | ICD-10-CM

## 2020-06-15 DIAGNOSIS — O365930 Maternal care for other known or suspected poor fetal growth, third trimester, not applicable or unspecified: Secondary | ICD-10-CM

## 2020-06-15 DIAGNOSIS — O26843 Uterine size-date discrepancy, third trimester: Secondary | ICD-10-CM

## 2020-06-15 DIAGNOSIS — Z3A33 33 weeks gestation of pregnancy: Secondary | ICD-10-CM

## 2020-06-15 DIAGNOSIS — O365931 Maternal care for other known or suspected poor fetal growth, third trimester, fetus 1: Secondary | ICD-10-CM | POA: Diagnosis not present

## 2020-06-22 ENCOUNTER — Ambulatory Visit: Payer: Medicaid Other | Admitting: *Deleted

## 2020-06-22 ENCOUNTER — Ambulatory Visit: Payer: Medicaid Other | Attending: Obstetrics and Gynecology

## 2020-06-22 ENCOUNTER — Other Ambulatory Visit: Payer: Self-pay

## 2020-06-22 VITALS — BP 127/77 | HR 80

## 2020-06-22 DIAGNOSIS — O365931 Maternal care for other known or suspected poor fetal growth, third trimester, fetus 1: Secondary | ICD-10-CM | POA: Diagnosis not present

## 2020-06-22 DIAGNOSIS — Z362 Encounter for other antenatal screening follow-up: Secondary | ICD-10-CM | POA: Diagnosis not present

## 2020-06-22 DIAGNOSIS — O26843 Uterine size-date discrepancy, third trimester: Secondary | ICD-10-CM

## 2020-06-22 DIAGNOSIS — Z3A34 34 weeks gestation of pregnancy: Secondary | ICD-10-CM

## 2020-06-22 DIAGNOSIS — O365990 Maternal care for other known or suspected poor fetal growth, unspecified trimester, not applicable or unspecified: Secondary | ICD-10-CM | POA: Insufficient documentation

## 2020-06-22 DIAGNOSIS — O365930 Maternal care for other known or suspected poor fetal growth, third trimester, not applicable or unspecified: Secondary | ICD-10-CM | POA: Diagnosis not present

## 2020-06-23 ENCOUNTER — Other Ambulatory Visit: Payer: Self-pay | Admitting: *Deleted

## 2020-06-23 DIAGNOSIS — O365930 Maternal care for other known or suspected poor fetal growth, third trimester, not applicable or unspecified: Secondary | ICD-10-CM

## 2020-06-26 ENCOUNTER — Encounter: Payer: Self-pay | Admitting: Obstetrics and Gynecology

## 2020-06-26 ENCOUNTER — Telehealth (INDEPENDENT_AMBULATORY_CARE_PROVIDER_SITE_OTHER): Payer: Medicaid Other | Admitting: Obstetrics and Gynecology

## 2020-06-26 DIAGNOSIS — Z3A34 34 weeks gestation of pregnancy: Secondary | ICD-10-CM

## 2020-06-26 DIAGNOSIS — O0993 Supervision of high risk pregnancy, unspecified, third trimester: Secondary | ICD-10-CM

## 2020-06-26 DIAGNOSIS — O365930 Maternal care for other known or suspected poor fetal growth, third trimester, not applicable or unspecified: Secondary | ICD-10-CM

## 2020-06-26 DIAGNOSIS — D573 Sickle-cell trait: Secondary | ICD-10-CM

## 2020-06-26 DIAGNOSIS — O99013 Anemia complicating pregnancy, third trimester: Secondary | ICD-10-CM

## 2020-06-26 DIAGNOSIS — O365990 Maternal care for other known or suspected poor fetal growth, unspecified trimester, not applicable or unspecified: Secondary | ICD-10-CM

## 2020-06-26 DIAGNOSIS — Z3400 Encounter for supervision of normal first pregnancy, unspecified trimester: Secondary | ICD-10-CM

## 2020-06-26 NOTE — Progress Notes (Signed)
OBSTETRICS PRENATAL VIRTUAL VISIT ENCOUNTER NOTE  Provider location: [facility] [place]   I connected with [name] on 06/26/20 at  3:45 PM EDT by MyChart Video Encounter at home and verified that I am speaking with the correct person using two identifiers.   I discussed the limitations, risks, security and privacy concerns of performing an evaluation and management service virtually and the availability of in person appointments. I also discussed with the patient that there may be a patient responsible charge related to this service. The patient expressed understanding and agreed to proceed. Subjective:  Maricruz Lucero is a 19 y.o. G2P0010 at [redacted]w[redacted]d being seen today for ongoing prenatal care.  She is currently monitored for the following issues for this high-risk pregnancy and has Encounter for supervision of normal pregnancy, antepartum; Sickle cell trait (HCC); and Pregnancy affected by fetal growth restriction on their problem list.  Patient reports pelvic pressure.  Contractions: Not present. Vag. Bleeding: None.  Movement: Present. Denies any leaking of fluid.   The following portions of the patient's history were reviewed and updated as appropriate: allergies, current medications, past family history, past medical history, past social history, past surgical history and problem list.   Objective:  There were no vitals filed for this visit.  Fetal Status:     Movement: Present     General:  Alert, oriented and cooperative. Patient is in no acute distress.  Respiratory: Normal respiratory effort, no problems with respiration noted  Mental Status: Normal mood and affect. Normal behavior. Normal judgment and thought content.  Rest of physical exam deferred due to type of encounter  Imaging: [place] MFM FETAL BPP WO NON STRESS  Result Date: 06/22/2020 ----------------------------------------------------------------------  OBSTETRICS REPORT                        (Signed Final 06/22/2020 04:54 pm) ---------------------------------------------------------------------- Patient Info  ID #:       [id number]                          D.O.B.:  July 29, 2001 (19 yrs)  Name:       [facility]                   Visit Date: 06/22/2020 03:53 pm ---------------------------------------------------------------------- Performed By  Attending:        Ma Rings MD         Ref. Address:     [address]                                                             Rd                                                             [employer]  Performed By:     Tommie Raymond BS,       [facility]  Referred By:      Wilmer Floor LEFTWICH-                    KIRBY CNM ---------------------------------------------------------------------- Orders  #  Description                           Code        Ordered By  1  [place] MFM FETAL BPP WO NON               76819.01    YU FANG     STRESS  2  [place] MFM OB FOLLOW UP                   76816.01    YU FANG  3  [place] MFM UA CORD DOPPLER                76820.02    YU FANG ----------------------------------------------------------------------  #  Order #                     Accession #                Episode #  1  [id number]                   [id number]                 [id number]  2  [id number]                   [id number]                 [id number]  3  [id number]                   [id number]                 [id number] ---------------------------------------------------------------------- Indications  Maternal care for known or suspected poor      O36.5931  fetal growth, third trimester, fetus 1 IUGR  [redacted] weeks gestation of pregnancy                Z3A.51  Small for gestational age fetus affecting      O36.5990  management of mother  Size of fetus  inconsistent with dates in third O9.843  trimester  Encounter for other antenatal screening        Z36.2  follow-up ---------------------------------------------------------------------- Fetal Evaluation  Num Of Fetuses:  1  Fetal Heart Rate(bpm):  131  Cardiac Activity:       Observed  Presentation:           Cephalic  Placenta:               Fundal  P. Cord Insertion:      Previously Visualized  Amniotic Fluid  AFI FV:      Within normal limits  AFI Sum(cm)     %Tile       Largest Pocket(cm)  13.4            45          4.6  RUQ(cm)       RLQ(cm)       LUQ(cm)        LLQ(cm)  3.1           2.1           4.6            3.6 ---------------------------------------------------------------------- Biophysical Evaluation  Amniotic F.V:   Pocket => 2 cm             F. Tone:        Observed  F. Movement:    Observed                   Score:          8/8  F. Breathing:   Observed ---------------------------------------------------------------------- Biometry  BPD:      82.9  mm     G. Age:  33w 2d         22  %    CI:        79.19   %    70 - 86                                                          FL/HC:      20.1   %    19.4 - 21.8  HC:      294.5  mm     G. Age:  32w 4d        1.1  %    HC/AC:      1.06        0.96 - 1.11  AC:      279.1  mm     G. Age:  32w 0d        4.6  %    FL/BPD:     71.5   %    71 - 87  FL:       59.3  mm     G. Age:  30w 6d        < 1  %    FL/AC:      21.2   %    20 - 24  Est. FW:    1843  gm      4 lb 1 oz    2.7  % ---------------------------------------------------------------------- OB History  Gravidity:    2          SAB:   1 ---------------------------------------------------------------------- Gestational Age  LMP:           42w 3d        Date:  08/30/19  EDD:   06/05/20  U/S Today:     32w 1d                                        EDD:   08/16/20  Best:          34w 2d     Det. ByMarcella Dubs         EDD:   08/01/20                                       (01/10/20) ---------------------------------------------------------------------- Anatomy  Cranium:               Previously seen        LVOT:                   Previously seen  Cavum:                 Previously seen        Aortic Arch:            Previously seen  Ventricles:            Appears normal         Ductal Arch:            Previously seen  Choroid Plexus:        Previously seen        Diaphragm:              Appears normal  Cerebellum:            Previously seen        Stomach:                Appears normal, left                                                                        sided  Posterior Fossa:       Previously seen        Abdomen:                Previously seen  Nuchal Fold:           Not applicable (>20    Abdominal Wall:         Previously seen                         wks GA)  Face:                  Orbits and profile     Cord Vessels:           Previously seen                         previously seen  Lips:                  Previously seen        Kidneys:  Appear normal  Palate:                Previously seen        Bladder:                Appears normal  Thoracic:              Previously seen        Spine:                  Previously seen  Heart:                 Appears normal         Upper Extremities:      Previously seen                         (4CH, axis, and                         situs)  RVOT:                  Previously seen        Lower Extremities:      Previously seen  Other:  Heels and 5th digits previously visualized.  Technically difficult due to          fetal position. ---------------------------------------------------------------------- Doppler - Fetal Vessels  Umbilical Artery   S/D     %tile      RI    %tile      PI    %tile            ADFV    RDFV    2.9       73    0.65       76    1.07       85               No      No ---------------------------------------------------------------------- Cervix Uterus Adnexa  Cervix  Not visualized (advanced GA >24wks)   Uterus  No abnormality visualized.  Right Ovary  Within normal limits. No adnexal mass visualized.  Left Ovary  Within normal limits. No adnexal mass visualized.  Cul De Sac  No free fluid seen.  Adnexa  No abnormality visualized. ---------------------------------------------------------------------- Comments  This patient was seen for a follow up growth scan due to fetal  growth restriction noted during her prior ultrasound exams.  She denies any problems since her last exam and reports  feeling vigorous fetal movements throughout the day.  On today's exam, the EFW measures at the 3rd percentile for  her gestational age indicating fetal growth restriction.  The  fetus has grown about half a pound over the past 3 weeks.  There was normal amniotic fluid noted.  A biophysical profile performed today due to fetal growth  restriction was 8 out of 8.  Doppler studies of the umbilical arteries showed a normal  S/D ratio of 2.9.  There were no signs of absent or reversed  end-diastolic flow.  Due to fetal growth restriction, we will continue to follow her  with weekly fetal testing and umbilical artery Doppler studies.  Another biophysical profile was scheduled in 1 week.  We will reassess the fetal growth again in 3 weeks.  Due to fetal growth restriction, delivery is recommended at  around 37-38 weeks. ----------------------------------------------------------------------  Ma Rings, MD Electronically Signed Final Report   06/22/2020 04:54 pm ----------------------------------------------------------------------  [place] MFM FETAL BPP WO NON STRESS  Result Date: 06/16/2020 ----------------------------------------------------------------------  OBSTETRICS REPORT                       (Signed Final 06/16/2020 01:15 pm) ---------------------------------------------------------------------- Patient Info  ID #:       [id number]                          D.O.B.:  07/30/2001 (18 yrs)  Name:       [facility]                    Visit Date: 06/15/2020 03:23 pm ---------------------------------------------------------------------- Performed By  Attending:        Lin Landsman      Ref. Address:     [address]                    [place]                                                             [address]                                                             [place]  Performed By:     Reinaldo Raddle            [facility]  Referred By:      Wilmer Floor LEFTWICH-                    KIRBY CNM ---------------------------------------------------------------------- Orders  #  Description                           Code        Ordered By  1  [place] MFM  FETAL BPP WO NON               76819.01    YU FANG     STRESS  2  [place] MFM UA CORD DOPPLER                76820.02    YU FANG ----------------------------------------------------------------------  #  Order #                     Accession #                Episode #  1  [id number]                   [id number]                 [id number]  2  [id number]                   [id number]                 [id number] ---------------------------------------------------------------------- Indications  [redacted] weeks gestation of pregnancy                Z3A.33  Maternal care for known or suspected poor      O36.5931  fetal growth, third trimester, fetus 1 IUGR  Small for gestational age fetus affecting      O36.5990  management of mother  Uterine size-date discrepancy, second          O19.842  trimester  Encounter for other antenatal screening        Z36.2  follow-up ---------------------------------------------------------------------- Fetal Evaluation  Num Of Fetuses:         1  Fetal Heart Rate(bpm):  148  Cardiac Activity:       Observed  Presentation:           Cephalic   Placenta:               Fundal  P. Cord Insertion:      Previously Visualized  Amniotic Fluid  AFI FV:      Within normal limits  AFI Sum(cm)     %Tile       Largest Pocket(cm)  12.3            35          4.4  RUQ(cm)       RLQ(cm)       LUQ(cm)        LLQ(cm)  4.4           1.2           3.7            3 ---------------------------------------------------------------------- Biophysical Evaluation  Amniotic F.V:   Pocket => 2 cm             F. Tone:        Observed  F. Movement:    Observed                   Score:          8/8  F. Breathing:   Observed ---------------------------------------------------------------------- OB History  Gravidity:    2          SAB:   1 ---------------------------------------------------------------------- Gestational Age  LMP:           41w 3d        Date:  08/30/19  EDD:   06/05/20  Best:          33w 2d     Det. ByMarcella Dubs         EDD:   08/01/20                                      (01/10/20) ---------------------------------------------------------------------- Anatomy  Ventricles:            Previously seen        Kidneys:                Appear normal  Diaphragm:             Appears normal         Bladder:                Appears normal  Stomach:               Appears normal, left                         sided ---------------------------------------------------------------------- Doppler - Fetal Vessels  Umbilical Artery   S/D     %tile      RI    %tile      PI    %tile            ADFV    RDFV    2.8       63    0.64       68    0.96       66               No      No ---------------------------------------------------------------------- Cervix Uterus Adnexa  Cervix  Not visualized (advanced GA >24wks) ---------------------------------------------------------------------- Impression  Antenatal testing for known IUGR with last growth being  07/02 at the 8th%.  Today the biophysical profile was 8/8 with normal UA  Dopplers, fetal movement and amniotic  fluid. ---------------------------------------------------------------------- Recommendations  Continue weekly antenatal testing with UA dopplers.  Repeat BPP and growth is also scheduled in 2 weeks. ----------------------------------------------------------------------               Lin Landsman, MD Electronically Signed Final Report   06/16/2020 01:15 pm ----------------------------------------------------------------------  [place] MFM FETAL BPP WO NON STRESS  Result Date: 06/07/2020 ----------------------------------------------------------------------  OBSTETRICS REPORT                       (Signed Final 06/07/2020 04:30 pm) ---------------------------------------------------------------------- Patient Info  ID #:       [id number]                          D.O.B.:  05-28-2001 (18 yrs)  Name:       [facility]                   Visit Date: 06/07/2020 03:38 pm ---------------------------------------------------------------------- Performed By  Attending:        Lin Landsman      Ref. Address:     [address]                    [address]                                                             ,[place]  Performed By:     Percell Boston          [facility]  Referred By:      Wilmer Floor LEFTWICH-                    Craige Cotta CNM ---------------------------------------------------------------------- Orders  #  Description                           Code        Ordered By  1  [place] MFM FETAL BPP WO NON               76819.01    YU FANG     STRESS  2  [place] MFM UA CORD DOPPLER                76820.02    YU FANG ----------------------------------------------------------------------  #  Order #                     Accession #                Episode #  1  [id number]                   [id number]                 [id number]  2  [id number]                   [id number]                  [id number] ---------------------------------------------------------------------- Indications  Maternal care for known or suspected poor      O36.5931  fetal growth, third trimester, fetus 1 IUGR  [redacted] weeks gestation of pregnancy                Z3A.32  Small for gestational age fetus affecting      O36.5990  management of mother  Uterine size-date discrepancy, second          O54.842  trimester  Encounter for other antenatal screening        Z36.2  follow-up ---------------------------------------------------------------------- Fetal Evaluation  Num Of Fetuses:         1  Fetal Heart Rate(bpm):  168  Cardiac Activity:       Observed  Presentation:           Cephalic  Placenta:               Fundal  Amniotic Fluid  AFI FV:      Within normal limits  AFI Sum(cm)     %Tile       Largest  Pocket(cm)  14.7            51          5.2  RUQ(cm)       RLQ(cm)       LUQ(cm)        LLQ(cm)  2.2           4             3.3            5.2 ---------------------------------------------------------------------- Biophysical Evaluation  Amniotic F.V:   Within normal limits       F. Tone:        Observed  F. Movement:    Observed                   Score:          8/8  F. Breathing:   Observed ---------------------------------------------------------------------- OB History  Gravidity:    2          SAB:   1 ---------------------------------------------------------------------- Gestational Age  LMP:           40w 2d        Date:  08/30/19                 EDD:   06/05/20  Best:          32w 1d     Det. ByMarcella Dubs         EDD:   08/01/20                                      (01/10/20) ---------------------------------------------------------------------- Anatomy  Thoracic:              Appears normal         Bladder:                Appears normal  Stomach:               Appears normal, left                         sided ---------------------------------------------------------------------- Doppler - Fetal Vessels   Umbilical Artery   S/D     %tile      RI    %tile      PI    %tile            ADFV    RDFV   2.89       63    0.65       67    0.94       58                N       N ---------------------------------------------------------------------- Cervix Uterus Adnexa  Cervix  Not visualized (advanced GA >24wks) ---------------------------------------------------------------------- Impression  Antenatal testing performed given IUGR (EFW 8th% on 7/2  with normal AC)  Today the BPP was 8/8 with good amniotic fluid and fetal  movement  The UA Doppler is normal. ---------------------------------------------------------------------- Recommendations  Continue weekly testing with UA dopplers  Repeat growth in 2-3 weeks. ----------------------------------------------------------------------               Lin Landsman, MD Electronically Signed Final Report   06/07/2020 04:30 pm ----------------------------------------------------------------------  [place] MFM OB FOLLOW UP  Result Date: 06/22/2020 ----------------------------------------------------------------------  OBSTETRICS REPORT                       (  Signed Final 06/22/2020 04:54 pm) ---------------------------------------------------------------------- Patient Info  ID #:       [id number]                          D.O.B.:  2001/10/08 (18 yrs)  Name:       [facility]                   Visit Date: 06/22/2020 03:53 pm ---------------------------------------------------------------------- Performed By  Attending:        Ma Rings MD         Ref. Address:     [address]                                                             [address]                                                             [employer]  Performed By:     Tommie Raymond BS,       [facility]                                [facility]                                                             [facility]  Referred By:      Wilmer Floor LEFTWICH-                    KIRBY CNM ---------------------------------------------------------------------- Orders  #  Description                           Code        Ordered By  1  [place] MFM FETAL BPP WO NON               76819.01    YU FANG     STRESS  2  [place] MFM OB FOLLOW UP                   76816.01    YU FANG  3  [place] MFM UA CORD DOPPLER  16109.60    Rosana Hoes ----------------------------------------------------------------------  #  Order #                     Accession #                Episode #  1  [id number]                   [id number]                 [id number]  2  [id number]                   [id number]                 [id number]  3  [id number]                   [id number]                 [id number] ---------------------------------------------------------------------- Indications  Maternal care for known or suspected poor      O36.5931  fetal growth, third trimester, fetus 1 IUGR  [redacted] weeks gestation of pregnancy                Z3A.23  Small for gestational age fetus affecting      O36.5990  management of mother  Size of fetus inconsistent with dates in third O51.843  trimester  Encounter for other antenatal screening        Z36.2  follow-up ---------------------------------------------------------------------- Fetal Evaluation  Num Of Fetuses:         1  Fetal Heart Rate(bpm):  131  Cardiac Activity:       Observed  Presentation:           Cephalic  Placenta:               Fundal  P. Cord Insertion:      Previously Visualized  Amniotic Fluid  AFI FV:      Within normal limits  AFI Sum(cm)     %Tile       Largest Pocket(cm)  13.4            45          4.6  RUQ(cm)       RLQ(cm)       LUQ(cm)        LLQ(cm)  3.1           2.1           4.6            3.6 ---------------------------------------------------------------------- Biophysical Evaluation  Amniotic F.V:   Pocket => 2 cm             F. Tone:        Observed  F. Movement:    Observed                    Score:          8/8  F. Breathing:   Observed ---------------------------------------------------------------------- Biometry  BPD:      82.9  mm     G. Age:  33w 2d         22  %    CI:        79.19   %    70 - 86  FL/HC:      20.1   %    19.4 - 21.8  HC:      294.5  mm     G. Age:  32w 4d        1.1  %    HC/AC:      1.06        0.96 - 1.11  AC:      279.1  mm     G. Age:  32w 0d        4.6  %    FL/BPD:     71.5   %    71 - 87  FL:       59.3  mm     G. Age:  30w 6d        < 1  %    FL/AC:      21.2   %    20 - 24  Est. FW:    1843  gm      4 lb 1 oz    2.7  % ---------------------------------------------------------------------- OB History  Gravidity:    2          SAB:   1 ---------------------------------------------------------------------- Gestational Age  LMP:           42w 3d        Date:  08/30/19                 EDD:   06/05/20  U/S Today:     32w 1d                                        EDD:   08/16/20  Best:          34w 2d     Det. ByMarcella Dubs         EDD:   08/01/20                                      (01/10/20) ---------------------------------------------------------------------- Anatomy  Cranium:               Previously seen        LVOT:                   Previously seen  Cavum:                 Previously seen        Aortic Arch:            Previously seen  Ventricles:            Appears normal         Ductal Arch:            Previously seen  Choroid Plexus:        Previously seen        Diaphragm:              Appears normal  Cerebellum:            Previously seen        Stomach:                Appears normal, left  sided  Posterior Fossa:       Previously seen        Abdomen:                Previously seen  Nuchal Fold:           Not applicable (>20    Abdominal Wall:         Previously seen                         wks GA)  Face:                  Orbits  and profile     Cord Vessels:           Previously seen                         previously seen  Lips:                  Previously seen        Kidneys:                Appear normal  Palate:                Previously seen        Bladder:                Appears normal  Thoracic:              Previously seen        Spine:                  Previously seen  Heart:                 Appears normal         Upper Extremities:      Previously seen                         (4CH, axis, and                         situs)  RVOT:                  Previously seen        Lower Extremities:      Previously seen  Other:  Heels and 5th digits previously visualized.  Technically difficult due to          fetal position. ---------------------------------------------------------------------- Doppler - Fetal Vessels  Umbilical Artery   S/D     %tile      RI    %tile      PI    %tile            ADFV    RDFV    2.9       73    0.65       76    1.07       85               No      No ---------------------------------------------------------------------- Cervix Uterus Adnexa  Cervix  Not visualized (advanced GA >24wks)  Uterus  No abnormality visualized.  Right Ovary  Within normal limits. No adnexal mass visualized.  Left Ovary  Within normal limits. No adnexal mass visualized.  Cul De Sac  No free fluid  seen.  Adnexa  No abnormality visualized. ---------------------------------------------------------------------- Comments  This patient was seen for a follow up growth scan due to fetal  growth restriction noted during her prior ultrasound exams.  She denies any problems since her last exam and reports  feeling vigorous fetal movements throughout the day.  On today's exam, the EFW measures at the 3rd percentile for  her gestational age indicating fetal growth restriction.  The  fetus has grown about half a pound over the past 3 weeks.  There was normal amniotic fluid noted.  A biophysical profile performed today due to fetal growth  restriction  was 8 out of 8.  Doppler studies of the umbilical arteries showed a normal  S/D ratio of 2.9.  There were no signs of absent or reversed  end-diastolic flow.  Due to fetal growth restriction, we will continue to follow her  with weekly fetal testing and umbilical artery Doppler studies.  Another biophysical profile was scheduled in 1 week.  We will reassess the fetal growth again in 3 weeks.  Due to fetal growth restriction, delivery is recommended at  around 37-38 weeks. ----------------------------------------------------------------------                   Ma Rings, MD Electronically Signed Final Report   06/22/2020 04:54 pm ----------------------------------------------------------------------  [place] MFM OB FOLLOW UP  Result Date: 06/02/2020 ----------------------------------------------------------------------  OBSTETRICS REPORT                       (Signed Final 06/02/2020 05:11 pm) ---------------------------------------------------------------------- Patient Info  ID #:       [id number]                          D.O.B.:  10-Apr-2001 (18 yrs)  Name:       [facility]                   Visit Date: 06/02/2020 03:41 pm ---------------------------------------------------------------------- Performed By  Attending:        Ma Rings MD         Ref. Address:     [address]                                                             Rd                                                             Jacky Kindle  Performed By:     Lenise Arena        [facility]                                     [facility]  Referred By:      Wilmer Floor LEFTWICH-  KIRBY CNM ---------------------------------------------------------------------- Orders  #  Description                           Code        Ordered By  1  [place] MFM OB FOLLOW UP                   [id number]    Rosana Hoes  2  [place] MFM UA CORD DOPPLER                [id number]    YU FANG  ----------------------------------------------------------------------  #  Order #                     Accession #                Episode #  1  [id number]                   [id number]                 [id number]  2  [id number]                   [id number]                 [id number] ---------------------------------------------------------------------- Indications  Maternal care for known or suspected poor      O36.5931  fetal growth, third trimester, fetus 1 IUGR  Small for gestational age fetus affecting      O53.5990  management of mother  Uterine size-date discrepancy, second          O59.842  trimester  Encounter for other antenatal screening        Z36.2  follow-up  [redacted] weeks gestation of pregnancy                Z3A.31 ---------------------------------------------------------------------- Fetal Evaluation  Num Of Fetuses:         1  Fetal Heart Rate(bpm):  164  Cardiac Activity:       Observed  Presentation:           Cephalic  Placenta:               Posterior  P. Cord Insertion:      Previously Visualized  Amniotic Fluid  AFI FV:      Within normal limits  AFI Sum(cm)     %Tile       Largest Pocket(cm)  12.4            34          3.1  RUQ(cm)       RLQ(cm)       LUQ(cm)        LLQ(cm)  3.1           3.1           3.1            3.1 ---------------------------------------------------------------------- Biometry  BPD:      79.1  mm     G. Age:  31w 5d         50  %    CI:        78.39   %    70 - 86  FL/HC:      19.0   %    19.3 - 21.3  HC:      282.6  mm     G. Age:  31w 0d          8  %    HC/AC:      1.06        0.96 - 1.17  AC:      265.5  mm     G. Age:  30w 5d         25  %    FL/BPD:     68.0   %    71 - 87  FL:       53.8  mm     G. Age:  28w 4d        < 1  %    FL/AC:      20.3   %    20 - 24  Est. FW:    1510  gm      3 lb 5 oz      8  % ---------------------------------------------------------------------- OB History  Gravidity:    2          SAB:    1 ---------------------------------------------------------------------- Gestational Age  LMP:           39w 4d        Date:  08/30/19                 EDD:   06/05/20  U/S Today:     30w 4d                                        EDD:   08/07/20  Best:          31w 3d     Det. ByMarcella Dubs         EDD:   08/01/20                                      (01/10/20) ---------------------------------------------------------------------- Anatomy  Cranium:               Appears normal         LVOT:                   Previously seen  Cavum:                 Appears normal         Aortic Arch:            Previously seen  Ventricles:            Previously seen        Ductal Arch:            Previously seen  Choroid Plexus:        Previously seen        Diaphragm:              Appears normal  Cerebellum:            Previously seen        Stomach:                Appears normal, left  sided  Posterior Fossa:       Previously seen        Abdomen:                Appears normal  Nuchal Fold:           Not applicable (>20    Abdominal Wall:         Previously seen                         wks GA)  Face:                  Orbits and profile     Cord Vessels:           Previously seen                         previously seen  Lips:                  Previously seen        Kidneys:                Appear normal  Palate:                Appears normal         Bladder:                Appears normal  Thoracic:              Appears normal         Spine:                  Previously seen  Heart:                 Appears normal         Upper Extremities:      Previously seen                         (4CH, axis, and                         situs)  RVOT:                  Previously seen        Lower Extremities:      Previously seen  Other:  Heels and 5th digits previously visualized.  Technically difficult due to          fetal position.  ---------------------------------------------------------------------- Doppler - Fetal Vessels  Umbilical Artery   S/D     %tile      RI    %tile                             ADFV    RDFV   3.09       71    0.68       76                                No      No ---------------------------------------------------------------------- Cervix Uterus Adnexa  Cervix  Not visualized (advanced GA >24wks) ---------------------------------------------------------------------- Comments  This patient was seen for a follow up growth scan as the fetus  has been measuring in the  smaller normal range during her  prior ultrasound exams. She denies any problems since her  last exam and reports feeling vigorous fetal movements  throughout the day.  On today's exam, the EFW measures at the 8th percentile for  her gestational age indicating fetal growth restriction.  The  fetus has grown over 1 pound over the past 3 weeks.  There  was normal amniotic fluid noted.  Doppler studies of the umbilical arteries showed a normal  S/D ratio of 3.09.  There were no signs of absent or reversed  end-diastolic flow.  Fetal movements were noted throughout today's ultrasound  exam.  Due to fetal growth restriction, we will continue to follow her  with weekly fetal testing and umbilical artery Doppler studies.  We will reassess the fetal growth again in 3 weeks.  A biophysical profile and umbilical artery Doppler study was  scheduled in 1 week. ----------------------------------------------------------------------                   Ma Rings, MD Electronically Signed Final Report   06/02/2020 05:11 pm ----------------------------------------------------------------------  [place] MFM UA CORD DOPPLER  Result Date: 06/22/2020 ----------------------------------------------------------------------  OBSTETRICS REPORT                       (Signed Final 06/22/2020 04:54 pm) ---------------------------------------------------------------------- Patient Info   ID #:       [id number]                          D.O.B.:  [birth year] (18 yrs)  Name:       [facility]                   Visit Date: 06/22/2020 03:53 pm ---------------------------------------------------------------------- Performed By  Attending:        Ma Rings MD         Ref. Address:     [address]                                                             Rd                                                             [employer]  Performed By:     Tommie Raymond BS,       [facility]T                                [facility] at  [facility]  Referred By:      Wilmer Floor LEFTWICH-                    Craige Cotta CNM ---------------------------------------------------------------------- Orders  #  Description                           Code        Ordered By  1  [place] MFM FETAL BPP WO NON               76819.01    YU FANG     STRESS  2  [place] MFM OB FOLLOW UP                   76816.01    YU FANG  3  [place] MFM UA CORD DOPPLER                76820.02    YU FANG ----------------------------------------------------------------------  #  Order #                     Accession #                Episode #  1  [id number]                   [id number]                 [id number]  2  [id number]                   [id number]  3  [id number]                   [id number]                 [id number] ---------------------------------------------------------------------- Indications  Maternal care for known or suspected poor      O36.5931  fetal growth, third trimester, fetus 1 IUGR  [redacted] weeks gestation of pregnancy                Z3A.17  Small for gestational age fetus affecting      O36.5990  management of mother  Size of fetus inconsistent with dates in third O39.843  trimester  Encounter for other antenatal screening        Z36.2  follow-up  ---------------------------------------------------------------------- Fetal Evaluation  Num Of Fetuses:         1  Fetal Heart Rate(bpm):  131  Cardiac Activity:       Observed  Presentation:           Cephalic  Placenta:               Fundal  P. Cord Insertion:      Previously Visualized  Amniotic Fluid  AFI FV:      Within normal limits  AFI Sum(cm)     %Tile       Largest Pocket(cm)  13.4  45          4.6  RUQ(cm)       RLQ(cm)       LUQ(cm)        LLQ(cm)  3.1           2.1           4.6            3.6 ---------------------------------------------------------------------- Biophysical Evaluation  Amniotic F.V:   Pocket => 2 cm             F. Tone:        Observed  F. Movement:    Observed                   Score:          8/8  F. Breathing:   Observed ---------------------------------------------------------------------- Biometry  BPD:      82.9  mm     G. Age:  33w 2d         22  %    CI:        79.19   %    70 - 86                                                          FL/HC:      20.1   %    19.4 - 21.8  HC:      294.5  mm     G. Age:  32w 4d        1.1  %    HC/AC:      1.06        0.96 - 1.11  AC:      279.1  mm     G. Age:  32w 0d        4.6  %    FL/BPD:     71.5   %    71 - 87  FL:       59.3  mm     G. Age:  30w 6d        < 1  %    FL/AC:      21.2   %    20 - 24  Est. FW:    1843  gm      4 lb 1 oz    2.7  % ---------------------------------------------------------------------- OB History  Gravidity:    2          SAB:   1 ---------------------------------------------------------------------- Gestational Age  LMP:           42w 3d        Date:  08/30/19                 EDD:   06/05/20  U/S Today:     32w 1d                                        EDD:   08/16/20  Best:          34w 2d     Det. ByMarcella Dubs         EDD:   08/01/20                                      (  01/10/20) ---------------------------------------------------------------------- Anatomy  Cranium:                Previously seen        LVOT:                   Previously seen  Cavum:                 Previously seen        Aortic Arch:            Previously seen  Ventricles:            Appears normal         Ductal Arch:            Previously seen  Choroid Plexus:        Previously seen        Diaphragm:              Appears normal  Cerebellum:            Previously seen        Stomach:                Appears normal, left                                                                        sided  Posterior Fossa:       Previously seen        Abdomen:                Previously seen  Nuchal Fold:           Not applicable (>20    Abdominal Wall:         Previously seen                         wks GA)  Face:                  Orbits and profile     Cord Vessels:           Previously seen                         previously seen  Lips:                  Previously seen        Kidneys:                Appear normal  Palate:                Previously seen        Bladder:                Appears normal  Thoracic:              Previously seen        Spine:                  Previously seen  Heart:                 Appears normal         Upper  Extremities:      Previously seen                         (4CH, axis, and                         situs)  RVOT:                  Previously seen        Lower Extremities:      Previously seen  Other:  Heels and 5th digits previously visualized.  Technically difficult due to          fetal position. ---------------------------------------------------------------------- Doppler - Fetal Vessels  Umbilical Artery   S/D     %tile      RI    %tile      PI    %tile            ADFV    RDFV    2.9       73    0.65       76    1.07       85               No      No ---------------------------------------------------------------------- Cervix Uterus Adnexa  Cervix  Not visualized (advanced GA >24wks)  Uterus  No abnormality visualized.  Right Ovary  Within normal limits. No adnexal mass visualized.  Left Ovary   Within normal limits. No adnexal mass visualized.  Cul De Sac  No free fluid seen.  Adnexa  No abnormality visualized. ---------------------------------------------------------------------- Comments  This patient was seen for a follow up growth scan due to fetal  growth restriction noted during her prior ultrasound exams.  She denies any problems since her last exam and reports  feeling vigorous fetal movements throughout the day.  On today's exam, the EFW measures at the 3rd percentile for  her gestational age indicating fetal growth restriction.  The  fetus has grown about half a pound over the past 3 weeks.  There was normal amniotic fluid noted.  A biophysical profile performed today due to fetal growth  restriction was 8 out of 8.  Doppler studies of the umbilical arteries showed a normal  S/D ratio of 2.9.  There were no signs of absent or reversed  end-diastolic flow.  Due to fetal growth restriction, we will continue to follow her  with weekly fetal testing and umbilical artery Doppler studies.  Another biophysical profile was scheduled in 1 week.  We will reassess the fetal growth again in 3 weeks.  Due to fetal growth restriction, delivery is recommended at  around 37-38 weeks. ----------------------------------------------------------------------                   Ma Rings, MD Electronically Signed Final Report   06/22/2020 04:54 pm ----------------------------------------------------------------------  [place] MFM UA CORD DOPPLER  Result Date: 06/16/2020 ----------------------------------------------------------------------  OBSTETRICS REPORT                       (Signed Final 06/16/2020 01:15 pm) ---------------------------------------------------------------------- Patient Info  ID #:       [id number]                          D.O.B.:  01/26/2001 (18 yrs)  Name:       [facility]  Visit Date: 06/15/2020 03:23 pm ----------------------------------------------------------------------  Performed By  Attending:        Lin Landsman      Ref. Address:     [address]                    MD                                                             R[address]                                                             Jacky Kindle  Performed By:     Reinaldo Raddle            Location[facility]         [facility] at                                                             [facility]  Referred By:      Wilmer Floor LEFTWICH-                    Craige Cotta CNM ---------------------------------------------------------------------- Orders  #  Description                           Code        Ordered By  1  [place] MFM FETAL BPP WO NON               76819.01    YU FANG     STRESS  2  [place] MFM UA CORD DOPPLER                40981.19    YU FANG ----------------------------------------------------------------------  #  Order #                     Accession #                Episode #  1  [id number]                   [id number]  [id number]  2  [id number]                   [id number]                 [id number] ---------------------------------------------------------------------- Indications  [redacted] weeks gestation of pregnancy                Z3A.33  Maternal care for known or suspected poor      O36.5931  fetal growth, third trimester, fetus 1 IUGR  Small for gestational age fetus affecting      O36.5990  management of mother  Uterine size-date discrepancy, second          O26.842  trimester  Encounter for other antenatal screening        Z36.2  follow-up ---------------------------------------------------------------------- Fetal Evaluation  Num Of Fetuses:         1  Fetal Heart Rate(bpm):  148  Cardiac Activity:       Observed  Presentation:           Cephalic  Placenta:               Fundal  P. Cord Insertion:      Previously Visualized  Amniotic Fluid  AFI FV:      Within normal  limits  AFI Sum(cm)     %Tile       Largest Pocket(cm)  12.3            35          4.4  RUQ(cm)       RLQ(cm)       LUQ(cm)        LLQ(cm)  4.4           1.2           3.7            3 ---------------------------------------------------------------------- Biophysical Evaluation  Amniotic F.V:   Pocket => 2 cm             F. Tone:        Observed  F. Movement:    Observed                   Score:          8/8  F. Breathing:   Observed ---------------------------------------------------------------------- OB History  Gravidity:    2          SAB:   1 ---------------------------------------------------------------------- Gestational Age  LMP:           41w 3d        Date:  08/30/19                 EDD:   06/05/20  Best:          33w 2d     Det. ByMarcella Dubs:  Early Ultrasound         EDD:   08/01/20                                      (01/10/20) ---------------------------------------------------------------------- Anatomy  Ventricles:            Previously seen        Kidneys:                Appear normal  Diaphragm:             Appears normal  Bladder:                Appears normal  Stomach:               Appears normal, left                         sided ---------------------------------------------------------------------- Doppler - Fetal Vessels  Umbilical Artery   S/D     %tile      RI    %tile      PI    %tile            ADFV    RDFV    2.8       63    0.64       68    0.96       66               No      No ---------------------------------------------------------------------- Cervix Uterus Adnexa  Cervix  Not visualized (advanced GA >24wks) ---------------------------------------------------------------------- Impression  Antenatal testing for known IUGR with last growth being  07/02 at the 8th%.  Today the biophysical profile was 8/8 with normal UA  Dopplers, fetal movement and amniotic fluid. ---------------------------------------------------------------------- Recommendations  Continue weekly antenatal  testing with UA dopplers.  Repeat BPP and growth is also scheduled in 2 weeks. ----------------------------------------------------------------------               Lin Landsman, MD Electronically Signed Final Report   06/16/2020 01:15 pm ----------------------------------------------------------------------  [place] MFM UA CORD DOPPLER  Result Date: 06/07/2020 ----------------------------------------------------------------------  OBSTETRICS REPORT                       (Signed Final 06/07/2020 04:30 pm) ---------------------------------------------------------------------- Patient Info  ID #:       [id number]                          D.O.B.:  21-Jul-2001 (18 yrs)  Name:       [facility]                   Visit Date: 06/07/2020 03:38 pm ---------------------------------------------------------------------- Performed By  Attending:        Lin Landsman      Ref. Address:     [address]                    MD                                                             Rd                                                             [place]y Kindle  Performed By:     Percell Boston          [facility]                                     Fetal Care  Referred By:      Wilmer Floor LEFTWICH-                    KIRBY CNM ---------------------------------------------------------------------- Orders  #  Description                           Code        Ordered By  1  [place] MFM FETAL BPP WO NON               76819.01    YU FANG     STRESS  2  [place] MFM UA CORD DOPPLER                16109.60    YU FANG ----------------------------------------------------------------------  #  Order #                     Accession #                Episode #  1  [id number]                   [id number]                 [id number]  2  [id number]                   [id number]                 [id number] ---------------------------------------------------------------------- Indications  Maternal care for known  or suspected poor      O36.5931  fetal growth, third trimester, fetus 1 IUGR  [redacted] weeks gestation of pregnancy                Z3A.32  Small for gestational age fetus affecting      O36.5990  management of mother  Uterine size-date discrepancy, second          O74.842  trimester  Encounter for other antenatal screening        Z36.2  follow-up ---------------------------------------------------------------------- Fetal Evaluation  Num Of Fetuses:         1  Fetal Heart Rate(bpm):  168  Cardiac Activity:       Observed  Presentation:           Cephalic  Placenta:               Fundal  Amniotic Fluid  AFI FV:      Within normal limits  AFI Sum(cm)     %Tile       Largest Pocket(cm)  14.7            51          5.2  RUQ(cm)       RLQ(cm)       LUQ(cm)        LLQ(cm)  2.2           4             3.3            5.2 ---------------------------------------------------------------------- Biophysical Evaluation  Amniotic F.V:   Within normal limits       F. Tone:  Observed  F. Movement:    Observed                   Score:          8/8  F. Breathing:   Observed ---------------------------------------------------------------------- OB History  Gravidity:    2          SAB:   1 ---------------------------------------------------------------------- Gestational Age  LMP:           40w 2d        Date:  08/30/19                 EDD:   06/05/20  Best:          32w 1d     Det. ByMarcella Dubs         EDD:   08/01/20                                      (01/10/20) ---------------------------------------------------------------------- Anatomy  Thoracic:              Appears normal         Bladder:                Appears normal  Stomach:               Appears normal, left                         sided ---------------------------------------------------------------------- Doppler - Fetal Vessels  Umbilical Artery   S/D     %tile      RI    %tile      PI    %tile            ADFV    RDFV   2.89       63    0.65       67    0.94        58                N       N ---------------------------------------------------------------------- Cervix Uterus Adnexa  Cervix  Not visualized (advanced GA >24wks) ---------------------------------------------------------------------- Impression  Antenatal testing performed given IUGR (EFW 8th% on 7/2  with normal AC)  Today the BPP was 8/8 with good amniotic fluid and fetal  movement  The UA Doppler is normal. ---------------------------------------------------------------------- Recommendations  Continue weekly testing with UA dopplers  Repeat growth in 2-3 weeks. ----------------------------------------------------------------------               Lin Landsman, MD Electronically Signed Final Report   06/07/2020 04:30 pm ----------------------------------------------------------------------  [place] MFM UA CORD DOPPLER  Result Date: 06/02/2020 ----------------------------------------------------------------------  OBSTETRICS REPORT                       (Signed Final 06/02/2020 05:11 pm) ---------------------------------------------------------------------- Patient Info  ID #:       [id number]                          D.O.B.:  2001-04-03 (18 yrs)  Name:       [facility]                   Visit Date: 06/02/2020 03:41 pm ---------------------------------------------------------------------- Performed By  Attending:  Ma Rings MD         Ref. Address:     [address]                                                             Rd                                                             Jacky Kindle  Performed By:     Lenise Arena        [facility]  Referred By:      Wil[facility] LEFTWICH-                    Craige Cotta CNM ---------------------------------------------------------------------- Orders  #  Description                           Code        Ordered By  1  [place] MFM OB FOLLOW UP                    [phone]    Rosana Hoes  2  [place] MFM UA CORD DOPPLER                [id number]    YU FANG ----------------------------------------------------------------------  #  Order #                     Accession #                Episode #  1  [id number]                   [id number]  2  [id number]                   [id number]                 [id number] ---------------------------------------------------------------------- Indications  Maternal care for known or suspected poor      O36.5931  fetal growth, third trimester, fetus 1 IUGR  Small for gestational age fetus affecting      O36.5990  management of mother  Uterine size-date discrepancy, second          O11.842  trimester  Encounter for other antenatal screening        Z36.2  follow-up  [redacted] weeks gestation of pregnancy                Z3A.31 ---------------------------------------------------------------------- Fetal Evaluation  Num Of Fetuses:  1  Fetal Heart Rate(bpm):  164  Cardiac Activity:       Observed  Presentation:           Cephalic  Placenta:               Posterior  P. Cord Insertion:      Previously Visualized  Amniotic Fluid  AFI FV:      Within normal limits  AFI Sum(cm)     %Tile       Largest Pocket(cm)  12.4            34          3.1  RUQ(cm)       RLQ(cm)       LUQ(cm)        LLQ(cm)  3.1           3.1           3.1            3.1 ---------------------------------------------------------------------- Biometry  BPD:      79.1  mm     G. Age:  31w 5d         50  %    CI:        78.39   %    70 - 86                                                          FL/HC:      19.0   %    19.3 - 21.3  HC:      282.6  mm     G. Age:  31w 0d          8  %    HC/AC:      1.06        0.96 - 1.17  AC:      265.5  mm     G. Age:  30w 5d         25  %    FL/BPD:     68.0   %    71 - 87  FL:       53.8  mm     G. Age:  28w 4d        < 1  %    FL/AC:      20.3   %    20 - 24  Est. FW:    1510  gm      3 lb 5 oz      8  %  ---------------------------------------------------------------------- OB History  Gravidity:    2          SAB:   1 ---------------------------------------------------------------------- Gestational Age  LMP:           39w 4d        Date:  08/30/19                 EDD:   06/05/20  U/S Today:     30w 4d                                        EDD:   08/07/20  Best:  31w 3d     Det. ByMarcella Dubs         EDD:   08/01/20                                      (01/10/20) ---------------------------------------------------------------------- Anatomy  Cranium:               Appears normal         LVOT:                   Previously seen  Cavum:                 Appears normal         Aortic Arch:            Previously seen  Ventricles:            Previously seen        Ductal Arch:            Previously seen  Choroid Plexus:        Previously seen        Diaphragm:              Appears normal  Cerebellum:            Previously seen        Stomach:                Appears normal, left                                                                        sided  Posterior Fossa:       Previously seen        Abdomen:                Appears normal  Nuchal Fold:           Not applicable (>20    Abdominal Wall:         Previously seen                         wks GA)  Face:                  Orbits and profile     Cord Vessels:           Previously seen                         previously seen  Lips:                  Previously seen        Kidneys:                Appear normal  Palate:                Appears normal         Bladder:                Appears normal  Thoracic:  Appears normal         Spine:                  Previously seen  Heart:                 Appears normal         Upper Extremities:      Previously seen                         (4CH, axis, and                         situs)  RVOT:                  Previously seen        Lower Extremities:      Previously seen  Other:  Heels and 5th digits  previously visualized.  Technically difficult due to          fetal position. ---------------------------------------------------------------------- Doppler - Fetal Vessels  Umbilical Artery   S/D     %tile      RI    %tile                             ADFV    RDFV   3.09       71    0.68       76                                No      No ---------------------------------------------------------------------- Cervix Uterus Adnexa  Cervix  Not visualized (advanced GA >24wks) ---------------------------------------------------------------------- Comments  This patient was seen for a follow up growth scan as the fetus  has been measuring in the smaller normal range during her  prior ultrasound exams. She denies any problems since her  last exam and reports feeling vigorous fetal movements  throughout the day.  On today's exam, the EFW measures at the 8th percentile for  her gestational age indicating fetal growth restriction.  The  fetus has grown over 1 pound over the past 3 weeks.  There  was normal amniotic fluid noted.  Doppler studies of the umbilical arteries showed a normal  S/D ratio of 3.09.  There were no signs of absent or reversed  end-diastolic flow.  Fetal movements were noted throughout today's ultrasound  exam.  Due to fetal growth restriction, we will continue to follow her  with weekly fetal testing and umbilical artery Doppler studies.  We will reassess the fetal growth again in 3 weeks.  A biophysical profile and umbilical artery Doppler study was  scheduled in 1 week. ----------------------------------------------------------------------                   Ma Rings, MD Electronically Signed Final Report   06/02/2020 05:11 pm ----------------------------------------------------------------------   Assessment and Plan:  Pregnancy: G2P0010 at [redacted]w[redacted]d 1. Supervision of normal first pregnancy, antepartum Patient is doing well without complaints Cultures next visit Patient is still researching  pediatricians Patient does not plan on using contraception  2. Sickle cell trait (HCC)   3. Pregnancy affected by fetal growth restriction Follow up scans and weekly BPP with MFM Current plan is for delivery at 37-38 weeks. Patient is requesting 8/18 as induction date  Preterm  labor symptoms and general obstetric precautions including but not limited to vaginal bleeding, contractions, leaking of fluid and fetal movement were reviewed in detail with the patient. I discussed the assessment and treatment plan with the patient. The patient was provided an opportunity to ask questions and all were answered. The patient agreed with the plan and demonstrated an understanding of the instructions. The patient was advised to call back or seek an in-person office evaluation/go to MAU at [facility] for any urgent or concerning symptoms. Please refer to After Visit Summary for other counseling recommendations.   I provided 15 minutes of face-to-face time during this encounter.  Return in about 2 weeks (around 07/10/2020) for in person, ROB, High risk.  Future Appointments  Date Time Provider Department Center  06/26/2020  3:45 PM Nicoya Friel, Gigi Gin, MD [facility] None  06/29/2020  2:30 PM [facility] NURSE [facility]  06/29/2020  2:45 PM [facility] US4 [facility]  07/06/2020  3:30 PM [facility] NURSE [facility]  07/06/2020  3:45 PM [facility] US4 [facility]  07/13/2020  3:15 PM [facility] NURSE [facility]  07/13/2020  3:30 PM [facility] US3 [facility]    Catalina Antigua, MD [facility] [employer], [facility]

## 2020-06-26 NOTE — Progress Notes (Signed)
Pt. States she has pressure on her right side. Denies bleeding and any abnormal discharge.

## 2020-06-26 NOTE — Patient Instructions (Signed)
[facility] [address] [place] [phone]  [facility] [address] [place] [phone] [facility] [address] [phone]  [facility] Main: [address] [phone] [place]: [address] [phone]  [facility] [address], [place] [phone]  [facility] [address], [place] [phone]  [facility] [address], [place] [phone]  [facility] [place]: [address] [phone] [place]: [address] [phone] [place]: [address], [address] [phone] [place]: [address] [phone]  [facility] [address], [place] [phone]  [facility] [address], [address] [phone]  [facility] [address] [phone]  [facility] [address], [place] [phone]  [facility] [address], [place] [phone]  [facility] [address], [place] [phone]  [facility] [address], [place] [phone]  [facility] [place]: [address], [place] [phone] FM @ [place]: [address], [place] [phone] Peds @ [place]: [address], [place] [phone] Peds @ [place]: [address], [place] [phone]  [facility] [address], [place] [phone]  [facility] [address], [place] [phone]  Private Pediatrician Jack Amos, MD [address], [place] [phone] Shilpa Gosrani, MD [address], [place] [phone] David Rubin, MD [address] [phone] Nnaemeka-Okoyeh, MD ([facility]) [address],  [phone]        

## 2020-06-29 ENCOUNTER — Other Ambulatory Visit: Payer: Self-pay

## 2020-06-29 ENCOUNTER — Ambulatory Visit: Payer: Medicaid Other | Admitting: *Deleted

## 2020-06-29 ENCOUNTER — Ambulatory Visit: Payer: Medicaid Other | Attending: Obstetrics and Gynecology

## 2020-06-29 DIAGNOSIS — O365990 Maternal care for other known or suspected poor fetal growth, unspecified trimester, not applicable or unspecified: Secondary | ICD-10-CM | POA: Diagnosis present

## 2020-06-29 DIAGNOSIS — O365931 Maternal care for other known or suspected poor fetal growth, third trimester, fetus 1: Secondary | ICD-10-CM

## 2020-06-29 DIAGNOSIS — Z3A35 35 weeks gestation of pregnancy: Secondary | ICD-10-CM | POA: Insufficient documentation

## 2020-06-29 DIAGNOSIS — Z362 Encounter for other antenatal screening follow-up: Secondary | ICD-10-CM | POA: Diagnosis not present

## 2020-06-29 DIAGNOSIS — O365930 Maternal care for other known or suspected poor fetal growth, third trimester, not applicable or unspecified: Secondary | ICD-10-CM

## 2020-06-29 DIAGNOSIS — O26843 Uterine size-date discrepancy, third trimester: Secondary | ICD-10-CM

## 2020-06-30 IMAGING — US US OB COMP LESS 14 WK
1 series · 15 of 26 positions shown · non-contrast
Comparison: 12/17/2019

CLINICAL DATA: Pregnant, passed blood clot today

EXAM:
OBSTETRIC <14 WK ULTRASOUND
TECHNIQUE: Transabdominal ultrasound was performed for evaluation of the
gestation as well as the maternal uterus and adnexal regions.

[Series 1: us ob comp less 14 wk · 26 acquisitions, 15 frames shown]
[im 1/26]
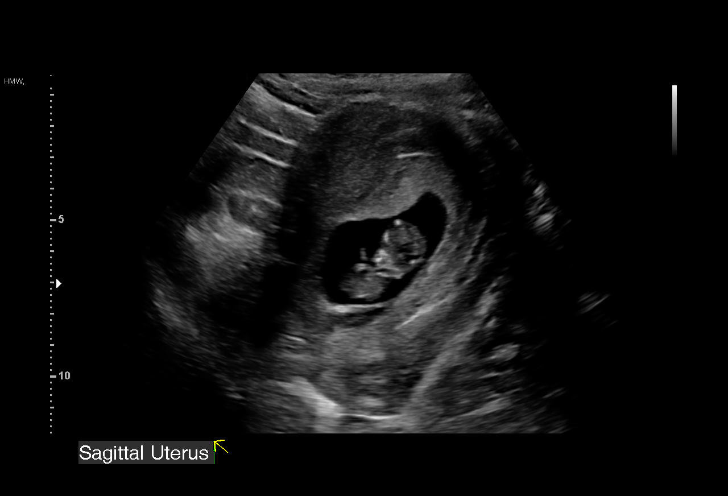
[im 3/26]
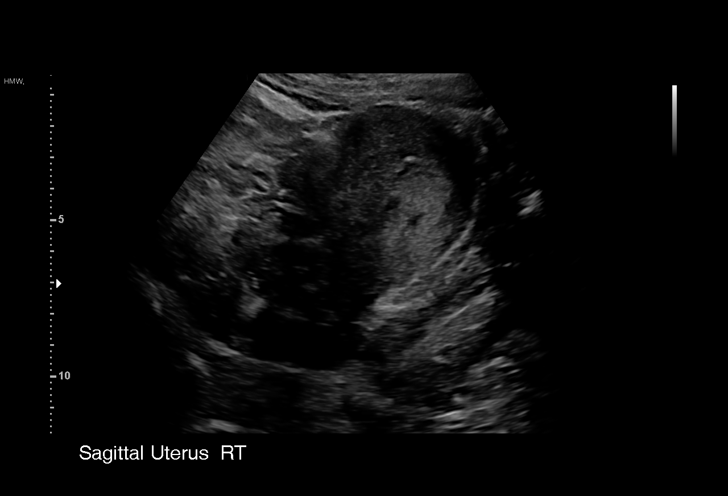
[im 5/26]
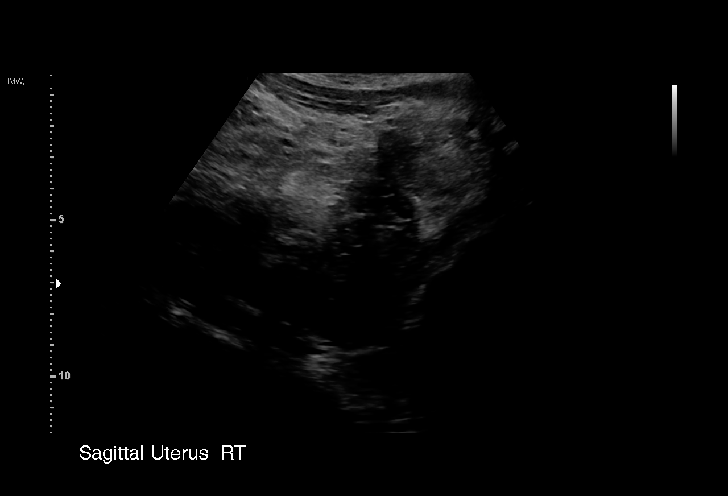
[im 7/26]
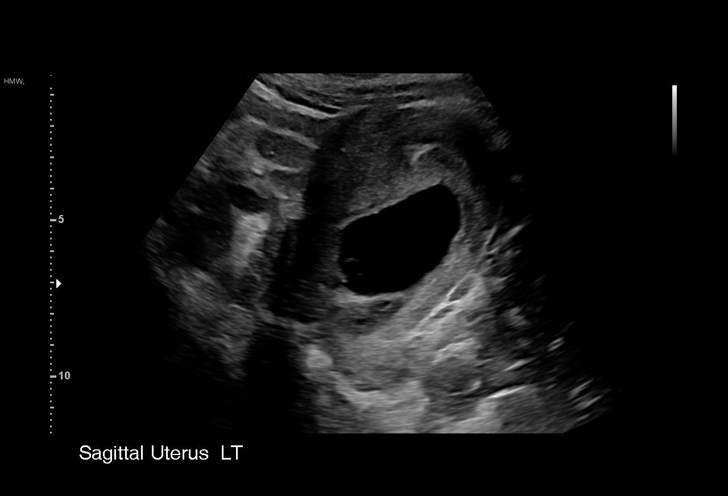
[im 8/26]
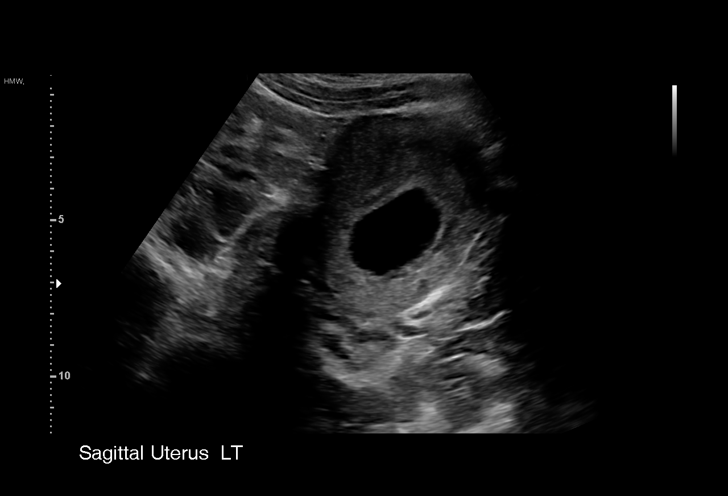
[im 10/26]
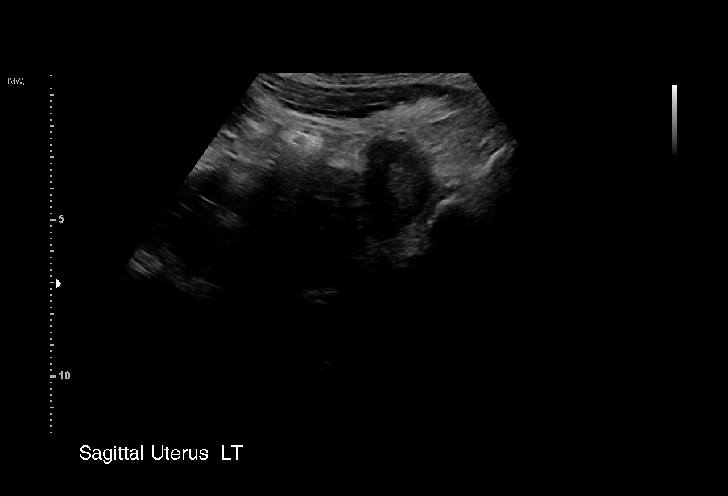
[im 12/26]
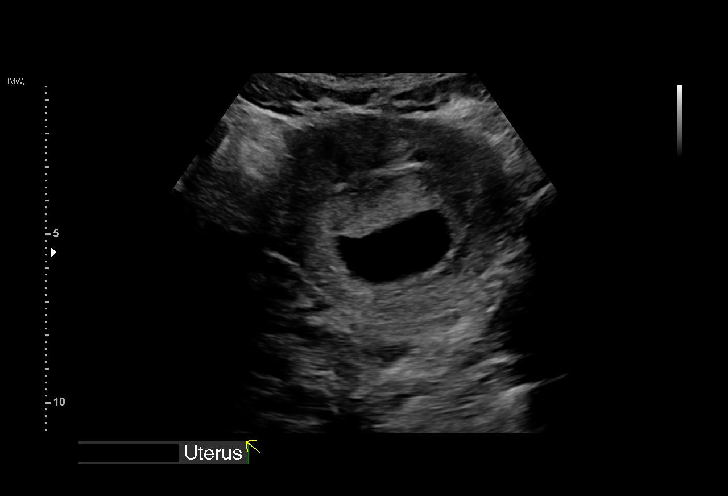
[im 14/26]
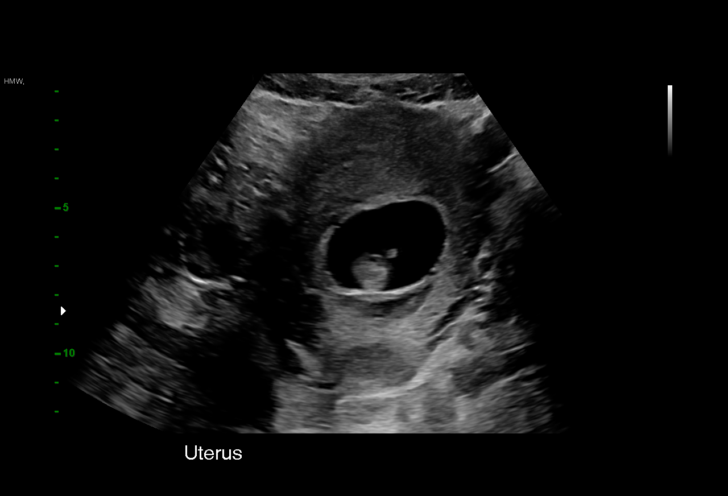
[im 15/26]
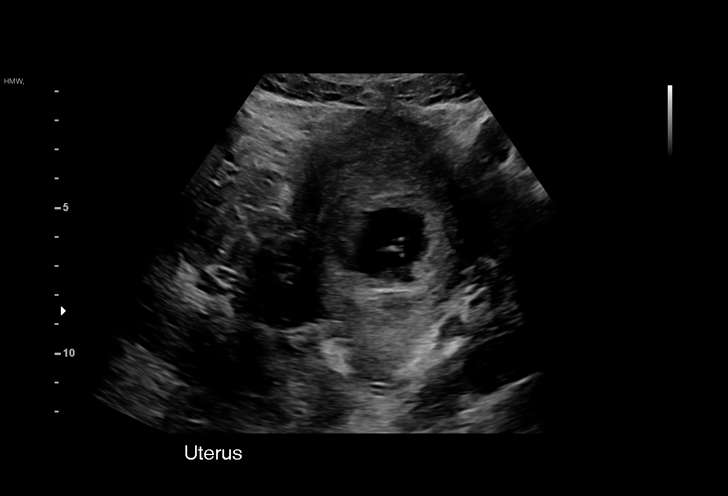
[im 17/26]
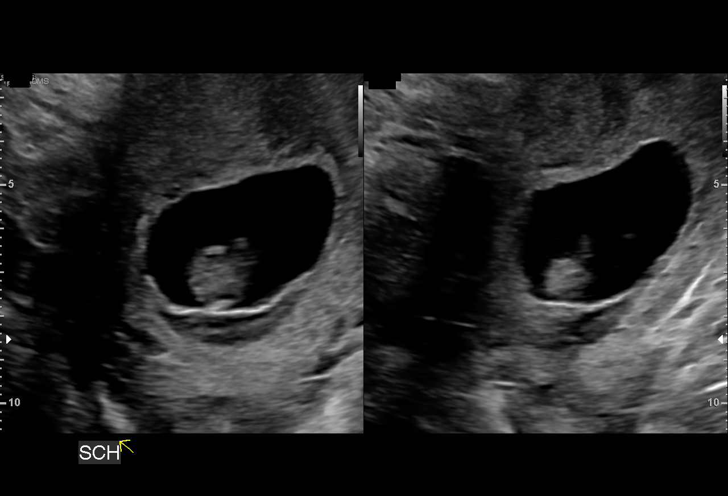
[im 19/26]
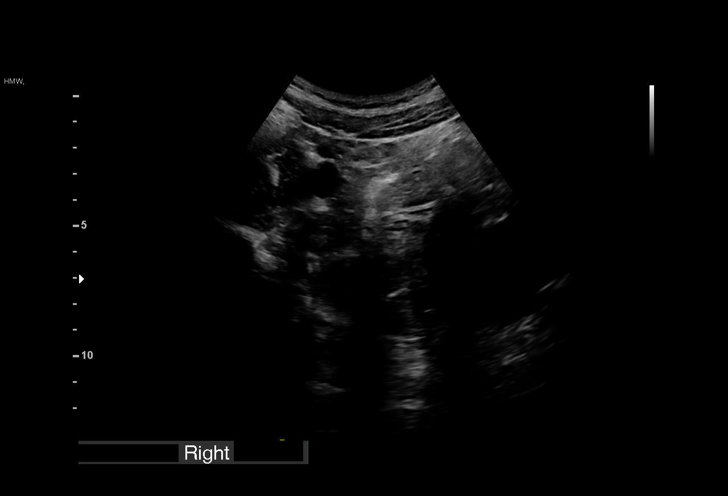
[im 20/26]
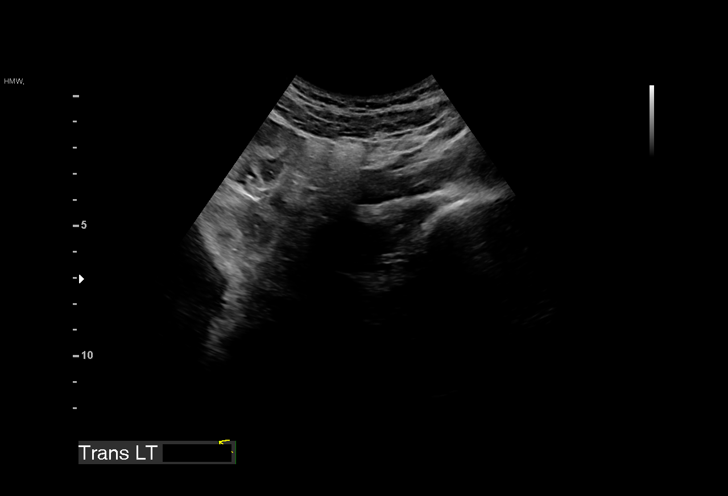
[im 22/26]
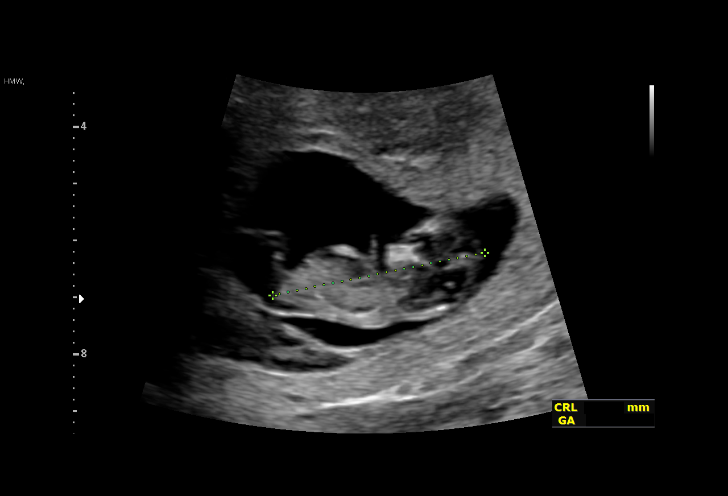
[im 24/26]
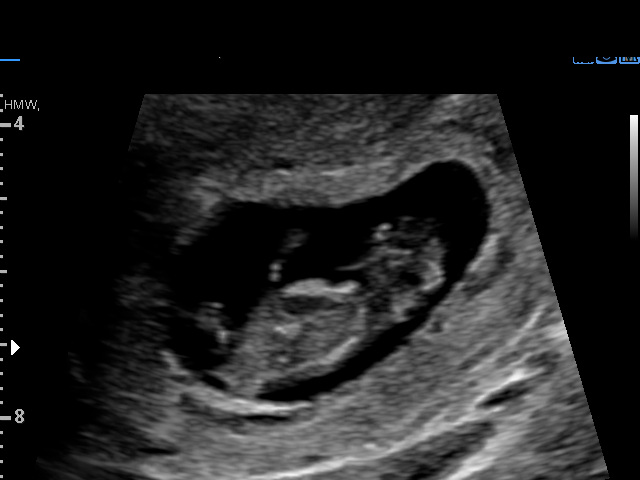
[im 26/26]
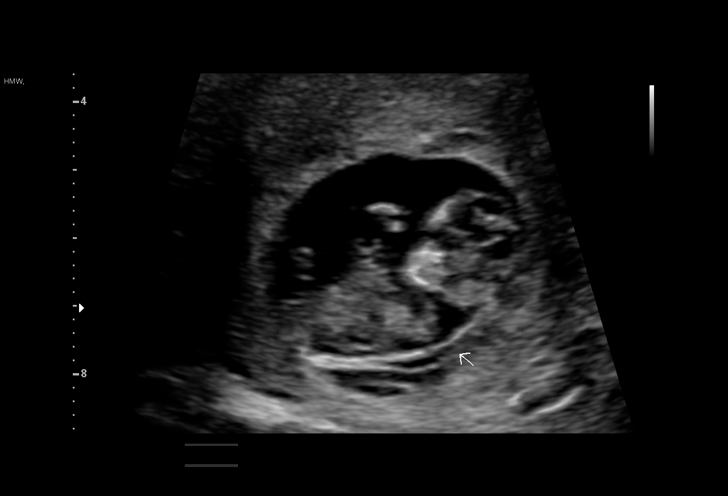

[15 of 26 positions shown; findings below may reference images not displayed]

FINDINGS: Intrauterine gestational sac: Single

Yolk sac:  Not Visualized.

Embryo:  Visualized.

Cardiac Activity: Visualized.

Heart Rate: 185 bpm

CRL:   41 mm   10 w 6 d                  US EDC: 08/01/2020

Subchorionic hemorrhage: There is a small subchorionic hemorrhage
posteriorly measuring 0.8 x 2.6 x 0.8 cm.

Maternal uterus/adnexae: Ovaries are not visualized. Maternal uterus
is grossly unremarkable.
IMPRESSION: 1. Single live intrauterine pregnancy as above estimated age 10
weeks and 6 days.
2. Small subchorionic hemorrhage, new since prior study. The
echogenic material previously seen within the lower endometrial
canal is no longer identified.

## 2020-07-04 ENCOUNTER — Inpatient Hospital Stay (HOSPITAL_COMMUNITY)
Admission: AD | Admit: 2020-07-04 | Discharge: 2020-07-04 | Disposition: A | Discharge status: Home / Self Care | Payer: Medicaid Other | Attending: Family Medicine | Admitting: Family Medicine

## 2020-07-04 ENCOUNTER — Other Ambulatory Visit: Payer: Self-pay

## 2020-07-04 ENCOUNTER — Encounter (HOSPITAL_COMMUNITY): Payer: Self-pay | Admitting: Family Medicine

## 2020-07-04 DIAGNOSIS — N898 Other specified noninflammatory disorders of vagina: Secondary | ICD-10-CM | POA: Insufficient documentation

## 2020-07-04 DIAGNOSIS — O365990 Maternal care for other known or suspected poor fetal growth, unspecified trimester, not applicable or unspecified: Secondary | ICD-10-CM

## 2020-07-04 DIAGNOSIS — Z87891 Personal history of nicotine dependence: Secondary | ICD-10-CM | POA: Diagnosis not present

## 2020-07-04 DIAGNOSIS — Z79899 Other long term (current) drug therapy: Secondary | ICD-10-CM | POA: Insufficient documentation

## 2020-07-04 DIAGNOSIS — O99891 Other specified diseases and conditions complicating pregnancy: Secondary | ICD-10-CM | POA: Diagnosis not present

## 2020-07-04 DIAGNOSIS — O4703 False labor before 37 completed weeks of gestation, third trimester: Secondary | ICD-10-CM | POA: Diagnosis present

## 2020-07-04 DIAGNOSIS — D573 Sickle-cell trait: Secondary | ICD-10-CM | POA: Insufficient documentation

## 2020-07-04 DIAGNOSIS — O99013 Anemia complicating pregnancy, third trimester: Secondary | ICD-10-CM | POA: Insufficient documentation

## 2020-07-04 DIAGNOSIS — F1290 Cannabis use, unspecified, uncomplicated: Secondary | ICD-10-CM | POA: Diagnosis not present

## 2020-07-04 DIAGNOSIS — Z3A35 35 weeks gestation of pregnancy: Secondary | ICD-10-CM | POA: Insufficient documentation

## 2020-07-04 DIAGNOSIS — O99323 Drug use complicating pregnancy, third trimester: Secondary | ICD-10-CM | POA: Diagnosis not present

## 2020-07-04 DIAGNOSIS — O26893 Other specified pregnancy related conditions, third trimester: Secondary | ICD-10-CM | POA: Diagnosis not present

## 2020-07-04 DIAGNOSIS — Z7982 Long term (current) use of aspirin: Secondary | ICD-10-CM | POA: Diagnosis not present

## 2020-07-04 LAB — URINALYSIS, ROUTINE W REFLEX MICROSCOPIC
Bilirubin Urine: NEGATIVE
Glucose, UA: NEGATIVE mg/dL
Hgb urine dipstick: NEGATIVE
Ketones, ur: NEGATIVE mg/dL
Nitrite: NEGATIVE
Protein, ur: NEGATIVE mg/dL
Specific Gravity, Urine: 1.005 (ref 1.005–1.030)
pH: 7 (ref 5.0–8.0)

## 2020-07-04 LAB — POCT FERN TEST: POCT Fern Test: NEGATIVE

## 2020-07-04 NOTE — MAU Note (Signed)
Presents with c/o ctxs and LOF.  Reports leaking began 1 hour ago, fluid clear.  Denies VB.  Reports +FM.

## 2020-07-04 NOTE — MAU Provider Note (Addendum)
History     CSN: [id number]  Arrival date and time: 07/04/20 1735   First Provider Initiated Contact with Patient 07/04/20 1823      Chief Complaint  Patient presents with  . Rupture of Membranes  . Contractions   HPI   Patient is a 19yo g1p0 at 74w5dwho presents for leakage of fluid. Patient reports that around 4pm she was sitting at home and had a gush of fluid that 'kept going.' She says that some of it was white. No vaginal bleeding. She talked to her mom who recommended she come to hospital for evaluation. She reports good fetal movement. Says she has had intermittent contractions for the past week, not experiencing them currently. No fevers or chills. No new sexual partners. No headaches, SOB, chest pain.   This pregnancy is complicated by fetal growth restriction with most recent [place] EFW 3%ile with normal dopplers.     Past Medical History:  Diagnosis Date  . Sickle cell trait ([facility]     Past Surgical History:  Procedure Laterality Date  . NO PAST SURGERIES      Family History  Problem Relation Age of Onset  . Asthma Father   . Asthma Sister     Social History   Tobacco Use  . Smoking status: Former Smoker    Types: Cigars  . Smokeless tobacco: Never Used  Vaping Use  . Vaping Use: Former  Substance Use Topics  . Alcohol use: Never  . Drug use: Not Currently    Types: Marijuana    Allergies: No Known Allergies  Medications Prior to Admission  Medication Sig Dispense Refill Last Dose  . aspirin EC 81 MG tablet Take 1 tablet (81 mg total) by mouth daily. 30 tablet 5 07/04/2020 at Unknown time  . prenatal vitamin w/FE, FA (NATACHEW) 29-1 MG CHEW chewable tablet Chew 1 tablet by mouth daily at 12 noon.   07/04/2020 at Unknown time  . Blood Pressure Monitoring (BLOOD PRESSURE CUFF) MISC 1 kit by Does not apply route once a week. (Patient not taking: Reported on 03/20/2020) 1 each 0   . butalbital-acetaminophen-caffeine (FIORICET) 50-325-40 MG tablet  Take 1-2 tablets by mouth every 6 (six) hours as needed for headache. (Patient not taking: Reported on 06/15/2020) 10 tablet 0   . Elastic Bandages & Supports (COMFORT FIT MATERNITY SUPP LG) MISC 1 each by Does not apply route daily as needed. (Patient not taking: Reported on 05/08/2020) 1 each 0     Review of Systems  Constitutional: Negative for activity change, appetite change and chills.  Respiratory: Negative for chest tightness.   Cardiovascular: Negative for chest pain.  Gastrointestinal: Negative for abdominal pain, diarrhea, nausea and vomiting.  Genitourinary: Negative for dyspareunia and dysuria.  Neurological: Negative for dizziness.   Physical Exam   Blood pressure 136/76, pulse 92, temperature 98.9 F (37.2 C), temperature source Oral, resp. rate 20, height _0  (1.676 m), weight 87.8 kg, last menstrual period 08/30/2019, SpO2 100 %.  Physical Exam Vitals and nursing note reviewed. Exam conducted with a chaperone present.  Constitutional:      Appearance: Normal appearance.  Cardiovascular:     Pulses: Normal pulses.  Pulmonary:     Effort: Pulmonary effort is normal.  Abdominal:     General: There is no distension.     Palpations: Abdomen is soft.     Tenderness: There is no abdominal tenderness.  Genitourinary:    Comments: SSE with chaperone present: Cervix is visually  closed. There is physiologic discharge present and NO pooling.  Skin:    General: Skin is warm and dry.  Neurological:     Mental Status: She is alert.  Psychiatric:        Mood and Affect: Mood normal.        Behavior: Behavior normal.    NST Cat I (FHR baseline 140, mod variability, pos accels, neg decels)  MAU Course  Procedures  MDM PE exam performed w chaperone present SSE: Cervix visually closed, neg pooling, neg ferning  NST Cat I (FHR baseline 140, mod variability, pos accels, neg decels) No contractions on Toco GBS, GC/Chlamydia obtained   Assessment and Plan   R/O PPROM   -neg ferning, neg pooling, reassuring exam -not contracting  -GBS/GC/Chlamydia obtained  -patient stable for discharge from MAU, preterm labor precautions provided   Janet Berlin 07/04/2020, 6:42 PM

## 2020-07-04 NOTE — Discharge Instructions (Signed)

## 2020-07-05 LAB — GC/CHLAMYDIA PROBE AMP (~~LOC~~) NOT AT ARMC
Chlamydia: NEGATIVE
Comment: NEGATIVE
Comment: NORMAL
Neisseria Gonorrhea: NEGATIVE

## 2020-07-06 ENCOUNTER — Ambulatory Visit: Payer: Medicaid Other

## 2020-07-06 ENCOUNTER — Ambulatory Visit (INDEPENDENT_AMBULATORY_CARE_PROVIDER_SITE_OTHER): Payer: Medicaid Other | Admitting: Obstetrics & Gynecology

## 2020-07-06 ENCOUNTER — Ambulatory Visit: Payer: Medicaid Other | Attending: Obstetrics

## 2020-07-06 ENCOUNTER — Encounter (HOSPITAL_COMMUNITY): Payer: Self-pay | Admitting: Family Medicine

## 2020-07-06 ENCOUNTER — Inpatient Hospital Stay (HOSPITAL_COMMUNITY): Payer: Medicaid Other | Admitting: Anesthesiology

## 2020-07-06 ENCOUNTER — Other Ambulatory Visit: Payer: Self-pay

## 2020-07-06 ENCOUNTER — Inpatient Hospital Stay (HOSPITAL_COMMUNITY)
Admission: AD | Admit: 2020-07-06 | Discharge: 2020-07-09 | DRG: 807 | Disposition: A | Discharge status: Home / Self Care | Payer: Medicaid Other | Attending: Obstetrics & Gynecology | Admitting: Obstetrics & Gynecology

## 2020-07-06 VITALS — BP 131/78 | HR 89 | Wt 190.8 lb

## 2020-07-06 DIAGNOSIS — Z20822 Contact with and (suspected) exposure to covid-19: Secondary | ICD-10-CM | POA: Diagnosis not present

## 2020-07-06 DIAGNOSIS — D573 Sickle-cell trait: Secondary | ICD-10-CM | POA: Diagnosis present

## 2020-07-06 DIAGNOSIS — Z3A36 36 weeks gestation of pregnancy: Secondary | ICD-10-CM

## 2020-07-06 DIAGNOSIS — O365930 Maternal care for other known or suspected poor fetal growth, third trimester, not applicable or unspecified: Secondary | ICD-10-CM | POA: Diagnosis not present

## 2020-07-06 DIAGNOSIS — O42913 Preterm premature rupture of membranes, unspecified as to length of time between rupture and onset of labor, third trimester: Secondary | ICD-10-CM | POA: Diagnosis present

## 2020-07-06 DIAGNOSIS — Z3400 Encounter for supervision of normal first pregnancy, unspecified trimester: Secondary | ICD-10-CM

## 2020-07-06 DIAGNOSIS — Z87891 Personal history of nicotine dependence: Secondary | ICD-10-CM | POA: Diagnosis not present

## 2020-07-06 DIAGNOSIS — O42919 Preterm premature rupture of membranes, unspecified as to length of time between rupture and onset of labor, unspecified trimester: Secondary | ICD-10-CM | POA: Diagnosis present

## 2020-07-06 DIAGNOSIS — O134 Gestational [pregnancy-induced] hypertension without significant proteinuria, complicating childbirth: Principal | ICD-10-CM | POA: Diagnosis present

## 2020-07-06 DIAGNOSIS — O9902 Anemia complicating childbirth: Secondary | ICD-10-CM | POA: Diagnosis present

## 2020-07-06 DIAGNOSIS — O26893 Other specified pregnancy related conditions, third trimester: Secondary | ICD-10-CM | POA: Diagnosis not present

## 2020-07-06 DIAGNOSIS — O365990 Maternal care for other known or suspected poor fetal growth, unspecified trimester, not applicable or unspecified: Secondary | ICD-10-CM

## 2020-07-06 DIAGNOSIS — O139 Gestational [pregnancy-induced] hypertension without significant proteinuria, unspecified trimester: Secondary | ICD-10-CM | POA: Diagnosis present

## 2020-07-06 LAB — PROTEIN / CREATININE RATIO, URINE
Creatinine, Urine: 47.44 mg/dL
Protein Creatinine Ratio: 0.15 mg/mg{Cre} (ref 0.00–0.15)
Total Protein, Urine: 7 mg/dL

## 2020-07-06 LAB — COMPREHENSIVE METABOLIC PANEL
ALT: 28 U/L (ref 0–44)
AST: 26 U/L (ref 15–41)
Albumin: 3.5 g/dL (ref 3.5–5.0)
Alkaline Phosphatase: 122 U/L (ref 38–126)
Anion gap: 10 (ref 5–15)
BUN: <5 mg/dL — ABNORMAL LOW (ref 6–20)
CO2: 21 mmol/L — ABNORMAL LOW (ref 22–32)
Calcium: 10.1 mg/dL (ref 8.9–10.3)
Chloride: 103 mmol/L (ref 98–111)
Creatinine, Ser: 0.61 mg/dL (ref 0.44–1.00)
GFR calc Af Amer: >60 mL/min (ref >60)
GFR calc non Af Amer: >60 mL/min (ref >60)
Glucose, Bld: 121 mg/dL — ABNORMAL HIGH (ref 70–99)
Potassium: 3.9 mmol/L (ref 3.5–5.1)
Sodium: 134 mmol/L — ABNORMAL LOW (ref 135–145)
Total Bilirubin: 0.6 mg/dL (ref 0.3–1.2)
Total Protein: 7.5 g/dL (ref 6.5–8.1)

## 2020-07-06 LAB — GROUP B STREP BY PCR: Group B strep by PCR: NEGATIVE

## 2020-07-06 LAB — CBC
HCT: 36.4 % (ref 36.0–46.0)
Hemoglobin: 12.2 g/dL (ref 12.0–15.0)
MCH: 30.8 pg (ref 26.0–34.0)
MCHC: 33.5 g/dL (ref 30.0–36.0)
MCV: 91.9 fL (ref 80.0–100.0)
Platelets: 244 10*3/uL (ref 150–400)
RBC: 3.96 MIL/uL (ref 3.87–5.11)
RDW: 11.9 % (ref 11.5–15.5)
WBC: 10.9 10*3/uL — ABNORMAL HIGH (ref 4.0–10.5)
nRBC: 0 % (ref 0.0–0.2)

## 2020-07-06 LAB — TYPE AND SCREEN
ABO/RH(D): O POS
Antibody Screen: NEGATIVE

## 2020-07-06 LAB — SARS CORONAVIRUS 2 BY RT PCR (HOSPITAL ORDER, PERFORMED IN ~~LOC~~ HOSPITAL LAB): SARS Coronavirus 2: NEGATIVE

## 2020-07-06 LAB — POCT FERN TEST: POCT Fern Test: NEGATIVE

## 2020-07-06 MED ORDER — DIPHENHYDRAMINE HCL 50 MG/ML IJ SOLN: 12.5000 mg | INTRAMUSCULAR | Status: DC | PRN

## 2020-07-06 MED ORDER — TERBUTALINE SULFATE 1 MG/ML IJ SOLN: 0.2500 mg | Freq: Once | INTRAMUSCULAR | Status: DC | PRN

## 2020-07-06 MED ORDER — LACTATED RINGERS IV SOLN: 500.0000 mL | Freq: Once | INTRAVENOUS | Status: DC

## 2020-07-06 MED ORDER — FENTANYL CITRATE (PF) 100 MCG/2ML IJ SOLN: 50.0000 ug | INTRAMUSCULAR | Status: DC | PRN

## 2020-07-06 MED ORDER — SOD CITRATE-CITRIC ACID 500-334 MG/5ML PO SOLN: 30.0000 mL | ORAL | Status: DC | PRN

## 2020-07-06 MED ORDER — LACTATED RINGERS IV SOLN: 500.0000 mL | INTRAVENOUS | Status: DC | PRN

## 2020-07-06 MED ORDER — FENTANYL-BUPIVACAINE-NACL 0.5-0.125-0.9 MG/250ML-% EP SOLN
12.0000 mL/h | EPIDURAL | Status: DC | PRN
  Filled 2020-07-06 (×2): qty 250

## 2020-07-06 MED ORDER — OXYTOCIN-SODIUM CHLORIDE 30-0.9 UT/500ML-% IV SOLN
1.0000 m[IU]/min | INTRAVENOUS | Status: DC
  Administered 2020-07-06: 2 m[IU]/min via INTRAVENOUS
  Filled 2020-07-06: qty 500

## 2020-07-06 MED ORDER — OXYTOCIN BOLUS FROM INFUSION
333.0000 mL | Freq: Once | INTRAVENOUS | Status: AC
  Administered 2020-07-07: 333 mL via INTRAVENOUS

## 2020-07-06 MED ORDER — SODIUM CHLORIDE 0.9 % IV SOLN
5.0000 10*6.[IU] | Freq: Once | INTRAVENOUS | Status: AC
  Administered 2020-07-06: 5 10*6.[IU] via INTRAVENOUS
  Filled 2020-07-06: qty 5

## 2020-07-06 MED ORDER — ACETAMINOPHEN 325 MG PO TABS: 650.0000 mg | ORAL_TABLET | ORAL | Status: DC | PRN

## 2020-07-06 MED ORDER — EPHEDRINE 5 MG/ML INJ: 10.0000 mg | INTRAVENOUS | Status: DC | PRN

## 2020-07-06 MED ORDER — PENICILLIN G POT IN DEXTROSE 60000 UNIT/ML IV SOLN
3.0000 10*6.[IU] | INTRAVENOUS | Status: DC
  Administered 2020-07-06: 3 10*6.[IU] via INTRAVENOUS
  Filled 2020-07-06: qty 50

## 2020-07-06 MED ORDER — LIDOCAINE HCL (PF) 1 % IJ SOLN: 30.0000 mL | INTRAMUSCULAR | Status: DC | PRN

## 2020-07-06 MED ORDER — SODIUM CHLORIDE (PF) 0.9 % IJ SOLN
INTRAMUSCULAR | Status: DC | PRN
  Administered 2020-07-06: 12 mL/h via EPIDURAL

## 2020-07-06 MED ORDER — PHENYLEPHRINE 40 MCG/ML (10ML) SYRINGE FOR IV PUSH (FOR BLOOD PRESSURE SUPPORT): 80.0000 ug | PREFILLED_SYRINGE | INTRAVENOUS | Status: DC | PRN

## 2020-07-06 MED ORDER — OXYCODONE-ACETAMINOPHEN 5-325 MG PO TABS: 2.0000 | ORAL_TABLET | ORAL | Status: DC | PRN

## 2020-07-06 MED ORDER — LACTATED RINGERS AMNIOINFUSION: INTRAVENOUS | Status: DC

## 2020-07-06 MED ORDER — ONDANSETRON HCL 4 MG/2ML IJ SOLN: 4.0000 mg | Freq: Four times a day (QID) | INTRAMUSCULAR | Status: DC | PRN

## 2020-07-06 MED ORDER — PHENYLEPHRINE 40 MCG/ML (10ML) SYRINGE FOR IV PUSH (FOR BLOOD PRESSURE SUPPORT)
80.0000 ug | PREFILLED_SYRINGE | INTRAVENOUS | Status: DC | PRN
  Filled 2020-07-06: qty 10

## 2020-07-06 MED ORDER — MISOPROSTOL 25 MCG QUARTER TABLET: 25.0000 ug | ORAL_TABLET | ORAL | Status: DC | PRN

## 2020-07-06 MED ORDER — OXYTOCIN-SODIUM CHLORIDE 30-0.9 UT/500ML-% IV SOLN: 2.5000 [IU]/h | INTRAVENOUS | Status: DC

## 2020-07-06 MED ORDER — LACTATED RINGERS IV SOLN: INTRAVENOUS | Status: DC

## 2020-07-06 MED ORDER — LIDOCAINE HCL (PF) 1 % IJ SOLN
INTRAMUSCULAR | Status: DC | PRN
  Administered 2020-07-06: 5 mL via EPIDURAL

## 2020-07-06 MED ORDER — BETAMETHASONE SOD PHOS & ACET 6 (3-3) MG/ML IJ SUSP
12.0000 mg | INTRAMUSCULAR | Status: DC
  Administered 2020-07-06: 12 mg via INTRAMUSCULAR
  Filled 2020-07-06: qty 5
  Filled 2020-07-06: qty 2

## 2020-07-06 MED ORDER — OXYCODONE-ACETAMINOPHEN 5-325 MG PO TABS: 1.0000 | ORAL_TABLET | ORAL | Status: DC | PRN

## 2020-07-06 NOTE — MAU Note (Signed)
Had a big gush @1155   (picture of puddle), has soaked 2 pads. Has a pad on now, it is wet. Fluid is clear. Having cramping at bottom of her stomach.

## 2020-07-06 NOTE — Progress Notes (Signed)
Pt is here for ROB, [redacted]w[redacted]d.  

## 2020-07-06 NOTE — Progress Notes (Signed)
Alicia Black is a 19 y.o. G2P0010 at [redacted]w[redacted]d admitted for PPROM  Subjective: Mostly comfortable with epidural, feeling a lot of pressure in her bottom.   Objective: BP 140/75   Pulse 73   Temp 98.2 F (36.8 C) (Oral)   Resp 17   Ht 5\' 6"  (1.676 m)   Wt 86.2 kg   LMP 08/30/2019 (Exact Date)   SpO2 100%   BMI 30.67 kg/m  No intake/output data recorded.  FHT:  FHR: 125 bpm, variability: moderate,  accelerations:  Present,  decelerations:  Absent UC:   regular, every 3-4 minutes  SVE:   4/90/-1 Pitocin @ 6 mu/min  Labs: Lab Results  Component Value Date   WBC 10.9 (H) 07/06/2020   HGB 12.2 07/06/2020   HCT 36.4 07/06/2020   MCV 91.9 07/06/2020   PLT 244 07/06/2020    Assessment / Plan: 19 yo G2P0010 at 36.0 EGA here for PPROM  Labor: On pitocin since 1650, now at 6. FB placed, but came out with traction, now 4/90/-1. Continue pitocin, consider IUPC if no cervical change. Fetal Wellbeing:  Category I, IUGR Pain Control:  Epidural Elevated BP: BPs >130 systolic more than 4 hours apart. GHTN v. Pre-E, Pre-E labs ordered. Asymptomatic. I/D:  PCN 2/2 GBS unknown and pre-term. PCR negative. Anticipated MOD:  vaginal delivery  05-22-1992 DO OB Fellow, [facility] 07/06/2020, 9:51 PM

## 2020-07-06 NOTE — H&P (Signed)
OBSTETRIC ADMISSION HISTORY AND PHYSICAL  Alicia Black is a 19 y.o. female G2P0010 with IUP at 84w0dby 6w u/s presenting for SROM. LOF at 1155 noted a puddle on the floor. She reports +FMs, no VB, no blurry vision, headaches or peripheral edema, and RUQ pain.  She plans on breast feeding. She has declined birth control in the past. She received her prenatal care at [facility] 6w u/s --->  Estimated Date of Delivery: 08/03/20  Sono:    06/22/20@[redacted]w[redacted]d , severe IUGR, normal anatomy, cephalic presentation, [id number] 2.7% EFW   Prenatal History/Complications:  Sickle cell trait Severe IUGR  Past Medical History: Past Medical History:  Diagnosis Date  . Sickle cell trait ([facility]     Past Surgical History: Past Surgical History:  Procedure Laterality Date  . NO PAST SURGERIES      Obstetrical History: OB History    Gravida  2   Para  0   Term      Preterm      AB  1   Living  0     SAB  1   TAB      Ectopic      Multiple      Live Births              Social History Social History   Socioeconomic History  . Marital status: Single    Spouse name: Not on file  . Number of children: Not on file  . Years of education: Not on file  . Highest education level: Not on file  Occupational History  . Occupation: housekeeping  Tobacco Use  . Smoking status: Former Smoker    Types: Cigars  . Smokeless tobacco: Never Used  Vaping Use  . Vaping Use: Former  Substance and Sexual Activity  . Alcohol use: Never  . Drug use: Not Currently    Types: Marijuana  . Sexual activity: Yes  Other Topics Concern  . Not on file  Social History Narrative  . Not on file   Social Determinants of Health   Financial Resource Strain:   . Difficulty of Paying Living Expenses:   Food Insecurity:   . Worried About RCharity fundraiserin the Last Year:   . RArboriculturistin the Last Year:   Transportation Needs:   . LFilm/video editor(Medical):   .Marland KitchenLack of  Transportation (Non-Medical):   Physical Activity:   . Days of Exercise per Week:   . Minutes of Exercise per Session:   Stress:   . Feeling of Stress :   Social Connections:   . Frequency of Communication with Friends and Family:   . Frequency of Social Gatherings with Friends and Family:   . Attends Religious Services:   . Active Member of Clubs or Organizations:   . Attends CArchivistMeetings:   .Marland KitchenMarital Status:     Family History: Family History  Problem Relation Age of Onset  . Asthma Father   . Asthma Sister     Allergies: No Known Allergies  Medications Prior to Admission  Medication Sig Dispense Refill Last Dose  . aspirin EC 81 MG tablet Take 1 tablet (81 mg total) by mouth daily. 30 tablet 5 07/06/2020 at Unknown time  . Blood Pressure Monitoring (BLOOD PRESSURE CUFF) MISC 1 kit by Does not apply route once a week. (Patient not taking: Reported on 03/20/2020) 1 each 0   . butalbital-acetaminophen-caffeine (FIORICET) 50-325-40 MG  tablet Take 1-2 tablets by mouth every 6 (six) hours as needed for headache. (Patient not taking: Reported on 06/15/2020) 10 tablet 0   . Elastic Bandages & Supports (COMFORT FIT MATERNITY SUPP LG) MISC 1 each by Does not apply route daily as needed. (Patient not taking: Reported on 05/08/2020) 1 each 0   . prenatal vitamin w/FE, FA (NATACHEW) 29-1 MG CHEW chewable tablet Chew 1 tablet by mouth daily at 12 noon.        Review of Systems   All systems reviewed and negative except as stated in HPI  Blood pressure 130/74, pulse 83, temperature 98.6 F (37 C), temperature source Oral, resp. rate 16, height 5' 6"  (1.676 m), weight 86.5 kg, last menstrual period 08/30/2019, SpO2 100 %. General appearance: alert, cooperative and no distress Lungs: normal effort Heart: regular rate and rhythm Abdomen: soft, non-tender; gravid Pelvic: closed cervical os on inspection, copious clear fluid from cervical os, normal vaginal tissue, normal  appearing cervix Extremities: Homans sign is negative, no sign of DVT Presentation: cephalic Fetal monitoringBaseline: 150 bpm, Variability: Good {> 6 bpm), Accelerations: Reactive and Decelerations: Absent Uterine activityFrequency: Every 5 minutes     Prenatal labs: ABO, Rh: O/Positive/-- (02/16 1527) Antibody: Negative (02/16 1527) Rubella: 5.72 (02/16 1527) RPR: Non Reactive (06/07 0940)  HBsAg: Negative (02/16 1527)  HIV: Non Reactive (06/07 0940)  GBS:   pending 1 hr Glucola passed Genetic screening  Sickle cell carrier, otherwise low risk Anatomy US unremarkable  Prenatal Transfer Tool  Maternal Diabetes: No Genetic Screening: Abnormal:  Results: Other:sickle cell carrier Maternal Ultrasounds/Referrals: IUGR Fetal Ultrasounds or other Referrals:  Referred to Materal Fetal Medicine  Maternal Substance Abuse:  No Significant Maternal Medications:  None Significant Maternal Lab Results: Other: GBS unknown  Results for orders placed or performed during the hospital encounter of 07/06/20 (from the past 24 hour(s))  POCT fern test   Collection Time: 07/06/20  1:11 PM  Result Value Ref Range   POCT Fern Test Negative = intact amniotic membranes     Patient Active Problem List   Diagnosis Date Noted  . Pregnancy affected by fetal growth restriction 06/26/2020  . Sickle cell trait ([place]) 02/04/2020  . Encounter for supervision of normal pregnancy, antepartum 01/18/2020    Assessment/Plan:  Alicia Black is a 19 y.o. G2P0010 at 87w0dhere for PPROM.  #Labor: Patient presents with PPROM 8/5 @1155 . She is feeling pressure but no contractions, on FHT she is contracting every 5 minutes. Ferning and pooling positive on SSE. Admit to L&D for IOL.  #Severe IUGR: 2.7%ile #Pain: PRN #FWB: Cat 1 #ID: PCN ordered given GBS pending #MOF: breastfeeding #MOC: unsure #Circ: n/a  AArrie Senate MD  07/06/2020, 1:50 PM

## 2020-07-06 NOTE — Progress Notes (Signed)
   PRENATAL VISIT NOTE  Subjective:  Alicia Black is a 19 y.o. G2P0010 at [redacted]w[redacted]d being seen today for ongoing prenatal care.  She is currently monitored for the following issues for this high-risk pregnancy and has Encounter for supervision of normal pregnancy, antepartum; Sickle cell trait (HCC); and Pregnancy affected by fetal growth restriction on their problem list.  Patient reports leaking fluid per vagina, ROM was r/o in MAU 07/04/20.  Contractions: Irregular. Vag. Bleeding: None.  Movement: Present. Denies leaking of fluid.   The following portions of the patient's history were reviewed and updated as appropriate: allergies, current medications, past family history, past medical history, past social history, past surgical history and problem list.   Objective:   Vitals:   07/06/20 1042  BP: 131/78  Pulse: 89  Weight: 190 lb 12.8 oz (86.5 kg)    Fetal Status:     Movement: Present     General:  Alert, oriented and cooperative. Patient is in no acute distress.  Skin: Skin is warm and dry. No rash noted.   Cardiovascular: Normal heart rate noted  Respiratory: Normal respiratory effort, no problems with respiration noted  Abdomen: Soft, gravid, appropriate for gestational age.  Pain/Pressure: Present     Pelvic: Cervical exam performed in the presence of a chaperone       clear mucus no pool, pos NTZ neg fern  Extremities: Normal range of motion.  Edema: None  Mental Status: Normal mood and affect. Normal behavior. Normal judgment and thought content.   Assessment and Plan:  Pregnancy: G2P0010 at [redacted]w[redacted]d 1. Supervision of normal first pregnancy, antepartum Neg evaluation for ROM suspect mucus d/c  2. Pregnancy affected by fetal growth restriction [place] f/u today, considering IOL 37+ weeks  Preterm labor symptoms and general obstetric precautions including but not limited to vaginal bleeding, contractions, leaking of fluid and fetal movement were reviewed in detail with the  patient. Please refer to After Visit Summary for other counseling recommendations.   Return in about 4 days (around 07/10/2020).  Future Appointments  Date Time Provider Department Center  07/06/2020  3:30 PM [facility] NURSE [facility]  07/06/2020  3:45 PM [facility]  07/13/2020  3:15 PM [facility] NURSE [facility]  07/13/2020  3:30 PM [facility] US3 [facility]    Scheryl Darter, MD

## 2020-07-06 NOTE — Anesthesia Procedure Notes (Signed)
Epidural Patient location during procedure: OB Start time: 07/06/2020 8:43 PM End time: 07/06/2020 8:58 PM  Staffing Anesthesiologist: Trevor Iha, MD Performed: anesthesiologist   Preanesthetic Checklist Completed: patient identified, IV checked, site marked, risks and benefits discussed, surgical consent, monitors and equipment checked, pre-op evaluation and timeout performed  Epidural Patient position: sitting Prep: DuraPrep and site prepped and draped Patient monitoring: continuous pulse ox and blood pressure Approach: midline Location: L2-L3 Injection technique: LOR air  Needle:  Needle type: Tuohy  Needle gauge: 17 G Needle length: 9 cm and 9 Needle insertion depth: 7 cm Catheter type: closed end flexible Catheter size: 19 Gauge Catheter at skin depth: 13 cm Test dose: negative  Assessment Events: blood not aspirated, injection not painful, no injection resistance, no paresthesia and negative IV test  Additional Notes Patient identified. Risks/Benefits/Options discussed with patient including but not limited to bleeding, infection, nerve damage, paralysis, failed block, incomplete pain control, headache, blood pressure changes, nausea, vomiting, reactions to medication both or allergic, itching and postpartum back pain. Confirmed with bedside nurse the patient's most recent platelet count. Confirmed with patient that they are not currently taking any anticoagulation, have any bleeding history or any family history of bleeding disorders. Patient expressed understanding and wished to proceed. All questions were answered. Sterile technique was used throughout the entire procedure. Please see nursing notes for vital signs. Test dose was given through epidural needle and negative prior to continuing to dose epidural or start infusion. Warning signs of high block given to the patient including shortness of breath, tingling/numbness in hands, complete motor block, or any concerning  symptoms with instructions to call for help. Patient was given instructions on fall risk and not to get out of bed. All questions and concerns addressed with instructions to call with any issues. 1 Attempt (S) . Patient tolerated procedure well.

## 2020-07-06 NOTE — Anesthesia Preprocedure Evaluation (Signed)
Anesthesia Evaluation  Patient identified by MRN, date of birth, ID band Patient awake    Reviewed: Allergy & Precautions, NPO status , Patient's Chart, lab work & pertinent test results  Airway Mallampati: II  TM Distance: >3 FB Neck ROM: Full    Dental no notable dental hx. (+) Teeth Intact, Dental Advisory Given   Pulmonary neg pulmonary ROS, former smoker,    Pulmonary exam normal breath sounds clear to auscultation       Cardiovascular negative cardio ROS Normal cardiovascular exam Rhythm:Regular Rate:Normal     Neuro/Psych negative neurological ROS  negative psych ROS   GI/Hepatic negative GI ROS, Neg liver ROS,   Endo/Other  negative endocrine ROS  Renal/GU negative Renal ROS     Musculoskeletal negative musculoskeletal ROS (+)   Abdominal   Peds  Hematology  (+) Sickle cell trait , Lab Results      Component                Value               Date                      WBC                      10.9 (H)            07/06/2020                HGB                      12.2                07/06/2020                HCT                      36.4                07/06/2020                MCV                      91.9                07/06/2020                PLT                      244                 07/06/2020              Anesthesia Other Findings   Reproductive/Obstetrics (+) Pregnancy                            Anesthesia Physical Anesthesia Plan  ASA: II  Anesthesia Plan: Epidural   Post-op Pain Management:    Induction:   PONV Risk Score and Plan:   Airway Management Planned:   Additional Equipment:   Intra-op Plan:   Post-operative Plan:   Informed Consent: I have reviewed the patients History and Physical, chart, labs and discussed the procedure including the risks, benefits and alternatives for the proposed anesthesia with the patient or authorized representative  who has indicated his/her understanding and acceptance.  Plan Discussed with:   Anesthesia Plan Comments: (36 Wk G2P0 w hx of IUGR and hgb [place] for LEA )       Anesthesia Quick Evaluation

## 2020-07-06 NOTE — Progress Notes (Addendum)
Labor Progress Note Alicia Black is a 19 y.o. G2P0010 at [redacted]w[redacted]d admitted for PPROM  S:   Feeling comfortable with her epidural, some shakiness.  Having occassionally variables which were initially relieved with positional changes but began to become more frequent.  Discussion of IUPC and amniofusion, explained rationale and answered patient's questions, addressed concerns.  Rebeckah agrees to these interventions.  Pitocin currently running at 8 mu/min.  Was increased at 2240.  GBS prophylaxis, has received 2 doses now  Support person at the bedside    O:  BP 138/71   Pulse 78   Temp 98.8 F (37.1 C) (Axillary)   Resp 16   Ht 5\' 6"  (1.676 m)   Wt 86.2 kg   LMP 08/30/2019 (Exact Date)   SpO2 100%   BMI 30.67 kg/m  EFM: FHT 140; mod variability; variable decels present; regular contractions q 3 min   CVE: 6/90/-1/vertex Cervix is very stretchy during exam and IUPC insertion  Recent labs: P:C: 0.15    A&P: 19 y.o. G2P0010 [redacted]w[redacted]d here for PPROM #Labor: Progressing well.  #Pain: managed with epidural #FWB: IUGR, continue to assess; begin amniofusion  #GBS: received prophylaxis  # Anticipate VD  [redacted]w[redacted]d, Student-MidWife 11:01 PM

## 2020-07-06 NOTE — Patient Instructions (Signed)
Intrauterine Growth Restriction  Intrauterine growth restriction (IUGR) is when a baby is not growing normally during pregnancy. A baby with IUGR is smaller than it should be and may weigh less than normal at birth. IUGR can result from a problem with the organ that supplies the unborn baby (fetus) with oxygen and nutrition (placenta). Usually, there is no way to prevent this type of problem. Babies with IUGR are at higher risk for early delivery and needing special (intensive) care after birth. What are the causes? The most common cause of IUGR is a problem with the placenta or umbilical cord that causes the fetus to get less oxygen or nutrition than needed. Other causes include:  The mother eating a very unhealthy diet (poor maternal nutrition).  Exposure to chemicals found in substances such as cigarettes, alcohol, and some drugs.  Some prescription medicines.  Other problems that develop in the womb (congenital birth defects).  Genetic disorders.  Infection.  Carrying more than one baby. What increases the risk? This condition is more likely to affect babies of mothers who:  Are over the age of 35 at the time of delivery.  Are younger than age 16 at the time of delivery.  Have medical conditions such as high blood pressure, preeclampsia, diabetes, heart or kidney disease, systemic lupus erythematosus, or anemia.  Live at a very high altitude during pregnancy.  Have a personal history or family history of: ? IUGR. ? A genetic disorder.  Take medicines during pregnancy that are related to congenital disabilities.  Come into contact with infected cat feces (toxoplasmosis).  Come into contact with chickenpox (varicella) or German measles (rubella).  Have or are at risk of getting an infectious disease such as syphilis, HIV, or herpes.  Eat an unhealthy diet during pregnancy.  Weigh less than 100 pounds.  Have had treatments to help her have children (infertility  treatments).  Use tobacco, drugs, or alcohol during pregnancy. What are the signs or symptoms? IUGR does not cause many symptoms. You might notice that your baby does not move or kick very often. Also, your belly may not be as big as expected for the stage of your pregnancy. How is this diagnosed? This condition is diagnosed with physical exams and prenatal exams. You may also have:  Fundal measurements to check the size of your uterus.  An ultrasound done to measure your baby's size compared to the size of other babies at the same stage of development (gestational age). Your health care provider will monitor your baby's growth with ultrasounds throughout pregnancy. You may also have tests to find the cause of IUGR. These may include:  Amniocentesis. This is a procedure that involves passing a needle into the uterus to collect a sample of fluid that surrounds the fetus (amniotic fluid). This may be done to check for signs of infection or congenital defects.  Tests to evaluate blood flow to your baby and placenta. How is this treated? In most cases, the goal of treatment is to treat the cause of IUGR. Your health care providers will monitor your pregnancy closely and help you manage your pregnancy. If your condition is caused by a placenta problem and your baby is not getting enough blood, you may need:  Medicine to start labor and deliver your baby early (induction).  Cesarean delivery, also called a C-section. In this procedure, your baby is delivered through an incision in your abdomen and uterus. Follow these instructions at home: Medicines  Take over-the-counter and prescription medicines   only as told by your health care provider. This includes vitamins and supplements.  Make sure that your health care provider knows about and approves of all the medicines, supplements, vitamins, eye drops, and creams that you use. General instructions  Eat a healthy diet that includes fresh fruits  and vegetables, lean proteins, whole grains, and calcium-rich foods such as milk, yogurt, and dark, leafy greens. Work with your health care provider or a dietitian to make sure that: ? You are getting enough nutrients. ? You are gaining enough weight.  Rest as needed. Try to get at least 8 hours of sleep every night.  Do not drink alcohol or use drugs.  Do not use any products that contain nicotine or tobacco, such as cigarettes and e-cigarettes. If you need help quitting, ask your health care provider.  Keep all follow-up visits as told by your health care provider. This is important. Get help right away if you:  Notice that your baby is moving less than usual or is not moving.  Have contractions that are 5 minutes or less apart, or that increase in frequency, intensity, or length.  Have signs and symptoms of infection, including a fever.  Have vaginal bleeding.  Have increased swelling in your legs, hands, or face.  Have vision changes, including seeing spots or having blurry or double vision.  Have a severe headache that does not go away.  Have sudden, sharp abdominal pain or low back pain.  Have an uncontrolled gush or trickle of fluid from your vagina. Summary  Intrauterine growth restriction (IUGR) is when a baby is not growing normally during pregnancy.  The most common cause of IUGR is a problem with the placenta or umbilical cord that causes the fetus to get less oxygen or nutrition than needed.  This condition is diagnosed with physical and prenatal exams. Your health care provider will monitor your baby's growth with ultrasounds throughout pregnancy.  Make sure that your health care provider knows about and approves of all the medicines, supplements, vitamins, eye drops, and creams that you use. This information is not intended to replace advice given to you by your health care provider. Make sure you discuss any questions you have with your health care  provider. Document Revised: 10/31/2017 Document Reviewed: 09/18/2017 Elsevier Patient Education  2020 Elsevier Inc.  

## 2020-07-07 ENCOUNTER — Encounter (HOSPITAL_COMMUNITY): Payer: Self-pay | Admitting: Obstetrics and Gynecology

## 2020-07-07 DIAGNOSIS — O365930 Maternal care for other known or suspected poor fetal growth, third trimester, not applicable or unspecified: Secondary | ICD-10-CM | POA: Diagnosis not present

## 2020-07-07 DIAGNOSIS — Z3A36 36 weeks gestation of pregnancy: Secondary | ICD-10-CM | POA: Diagnosis not present

## 2020-07-07 DIAGNOSIS — O134 Gestational [pregnancy-induced] hypertension without significant proteinuria, complicating childbirth: Secondary | ICD-10-CM | POA: Diagnosis not present

## 2020-07-07 LAB — CULTURE, BETA STREP (GROUP B ONLY)

## 2020-07-07 LAB — RPR: RPR Ser Ql: NONREACTIVE

## 2020-07-07 MED ORDER — ONDANSETRON HCL 4 MG/2ML IJ SOLN: 4.0000 mg | INTRAMUSCULAR | Status: DC | PRN

## 2020-07-07 MED ORDER — OXYCODONE HCL 5 MG PO TABS: 10.0000 mg | ORAL_TABLET | ORAL | Status: DC | PRN

## 2020-07-07 MED ORDER — DOCUSATE SODIUM 100 MG PO CAPS
100.0000 mg | ORAL_CAPSULE | Freq: Two times a day (BID) | ORAL | Status: DC
  Filled 2020-07-07 (×3): qty 1

## 2020-07-07 MED ORDER — ONDANSETRON HCL 4 MG PO TABS: 4.0000 mg | ORAL_TABLET | ORAL | Status: DC | PRN

## 2020-07-07 MED ORDER — MEASLES, MUMPS & RUBELLA VAC IJ SOLR: 0.5000 mL | Freq: Once | INTRAMUSCULAR | Status: DC

## 2020-07-07 MED ORDER — OXYCODONE HCL 5 MG PO TABS: 5.0000 mg | ORAL_TABLET | ORAL | Status: DC | PRN

## 2020-07-07 MED ORDER — SIMETHICONE 80 MG PO CHEW: 80.0000 mg | CHEWABLE_TABLET | ORAL | Status: DC | PRN

## 2020-07-07 MED ORDER — ACETAMINOPHEN 325 MG PO TABS
650.0000 mg | ORAL_TABLET | ORAL | Status: DC | PRN
  Administered 2020-07-07 (×2): 650 mg via ORAL
  Filled 2020-07-07 (×2): qty 2

## 2020-07-07 MED ORDER — ZOLPIDEM TARTRATE 5 MG PO TABS: 5.0000 mg | ORAL_TABLET | Freq: Every evening | ORAL | Status: DC | PRN

## 2020-07-07 MED ORDER — WITCH HAZEL-GLYCERIN EX PADS: 1.0000 "application " | MEDICATED_PAD | CUTANEOUS | Status: DC | PRN

## 2020-07-07 MED ORDER — DIBUCAINE (PERIANAL) 1 % EX OINT: 1.0000 "application " | TOPICAL_OINTMENT | CUTANEOUS | Status: DC | PRN

## 2020-07-07 MED ORDER — LACTATED RINGERS IV SOLN: INTRAVENOUS | Status: DC

## 2020-07-07 MED ORDER — TETANUS-DIPHTH-ACELL PERTUSSIS 5-2.5-18.5 LF-MCG/0.5 IM SUSP: 0.5000 mL | Freq: Once | INTRAMUSCULAR | Status: DC

## 2020-07-07 MED ORDER — COCONUT OIL OIL: 1.0000 "application " | TOPICAL_OIL | Status: DC | PRN

## 2020-07-07 MED ORDER — SENNOSIDES-DOCUSATE SODIUM 8.6-50 MG PO TABS
2.0000 | ORAL_TABLET | ORAL | Status: DC
  Filled 2020-07-07: qty 2

## 2020-07-07 MED ORDER — DIPHENHYDRAMINE HCL 25 MG PO CAPS: 25.0000 mg | ORAL_CAPSULE | Freq: Four times a day (QID) | ORAL | Status: DC | PRN

## 2020-07-07 MED ORDER — PRENATAL MULTIVITAMIN CH
1.0000 | ORAL_TABLET | Freq: Every day | ORAL | Status: DC
  Administered 2020-07-08 – 2020-07-09 (×2): 1 via ORAL
  Filled 2020-07-07 (×2): qty 1

## 2020-07-07 MED ORDER — IBUPROFEN 600 MG PO TABS
600.0000 mg | ORAL_TABLET | Freq: Four times a day (QID) | ORAL | Status: DC
  Administered 2020-07-07 – 2020-07-09 (×8): 600 mg via ORAL
  Filled 2020-07-07 (×7): qty 1

## 2020-07-07 MED ORDER — BENZOCAINE-MENTHOL 20-0.5 % EX AERO
1.0000 "application " | INHALATION_SPRAY | CUTANEOUS | Status: DC | PRN
  Filled 2020-07-07: qty 56

## 2020-07-07 NOTE — Anesthesia Postprocedure Evaluation (Signed)
Anesthesia Post Note  Patient: Chisa Kushner  Procedure(s) Performed: AN AD HOC LABOR EPIDURAL     Patient location during evaluation: Mother Baby Anesthesia Type: Epidural Level of consciousness: awake and alert and oriented Pain management: satisfactory to patient Vital Signs Assessment: post-procedure vital signs reviewed and stable Respiratory status: respiratory function stable Cardiovascular status: stable Postop Assessment: no headache, no backache, epidural receding, patient able to bend at knees, no signs of nausea or vomiting, adequate PO intake and able to ambulate Anesthetic complications: no   No complications documented.  Last Vitals:  Vitals:   07/07/20 0354 07/07/20 0751  BP: 130/73 123/71  Pulse: 66 74  Resp: 18 17  Temp: 36.6 C 37.1 C  SpO2: 100% 100%    Last Pain:  Vitals:   07/07/20 0751  TempSrc:   PainSc: 0-No pain   Pain Goal:                Epidural/Spinal Function Cutaneous sensation: Normal sensation (07/07/20 0751), Patient able to flex knees: Yes (07/07/20 0751), Patient able to lift hips off bed: Yes (07/07/20 0751), Back pain beyond tenderness at insertion site: No (07/07/20 0751), Progressively worsening motor and/or sensory loss: No (07/07/20 0751), Bowel and/or bladder incontinence post epidural: No (07/07/20 0751)  Karleen Dolphin

## 2020-07-07 NOTE — Lactation Note (Signed)
This note was copied from a baby's chart. Lactation Consultation Note  Patient Name: Alicia Black [id number] Date: 07/07/2020 Reason for consult: Initial assessment;Infant < 6lbs   P1, Baby 11 hours old.  [redacted]w[redacted]d. < 5 lbs and starting phototherapy.  19 year old mother.  Mother originally wanted to formula feed but has since decided she would like to try breastfeeding.  Mother has inverted nipples.  Attempted prepumping with minimal eversion. Attempted latching without and with #20NS.  Baby sustained latch for approx 10 min with NS. Mother took off NS and wanted to attempt again without NS but baby was unable to sustain latch. Baby latched briefly again with #20NS in football hold with no transfer. DEBP had been initiated with [place].  Mother states she does not like how if feels. Encouraged her to try pump again and stressed the importance of pumping at least every other feeding to stimulate her milk supply since infant is small. Mother states she would try in a little while. Reviewed volume guidelines for formula supplementation and burping. Mtoher gave baby 16 ml of formula with slow flow nipple. Mom made aware of O/P services, breastfeeding support groups, community resources, and our phone # for post-discharge questions.  Feed on demand with cues at least q 3-4  hours.  Place baby STS if not cueing to wake for feedings.      Maternal Data Has patient been taught Hand Expression?: Yes Does the patient have breastfeeding experience prior to this delivery?: No  Feeding Feeding Type: Breast Fed  LATCH Score Latch: Repeated attempts needed to sustain latch, nipple held in mouth throughout feeding, stimulation needed to elicit sucking reflex.  Audible Swallowing: None  Type of Nipple: Flat  Comfort (Breast/Nipple): Soft / non-tender  Hold (Positioning): Assistance needed to correctly position infant at breast and maintain latch.  LATCH Score:  5  Interventions Interventions: Breast feeding basics reviewed;Assisted with latch;Skin to skin;Hand express;Pre-pump if needed;Adjust position;Support pillows;Position options;Hand pump;DEBP  Lactation Tools Discussed/Used Tools: Pump;Nipple Shields Nipple shield size: 20 Breast pump type: Double-Electric Breast Pump;Manual Pump Review: Setup, frequency, and cleaning;Milk Storage Initiated by:: RN Date initiated:: 07/07/20   Consult Status Consult Status: Follow-up Date: 07/08/20 Follow-up type: In-patient    [name] [facility] 07/07/2020, 11:41 AM

## 2020-07-07 NOTE — Discharge Summary (Signed)
Postpartum Discharge Summary     Patient Name: Alicia Black DOB: [birth year] MRN: [id number]  Date of admission: 07/06/2020 Delivery date:07/07/2020  Delivering provider: Tonna Corner  Date of discharge: 07/09/2020  Admitting diagnosis: Preterm premature rupture of membranes (PPROM) with unknown onset of labor [O42.919] Intrauterine pregnancy: [redacted]w[redacted]d    Secondary diagnosis:  Principal Problem:   Postpartum care following vaginal delivery Active Problems:   [place]le cell trait ([place]   Pregnancy affected by fetal growth restriction   Preterm premature rupture of membranes (PPROM) delivered, current hospitalization   Gestational hypertension  Additional problems: as noted above    Discharge diagnosis: Preterm Pregnancy Delivered                                              Post partum procedures:none Augmentation: Pitocin Complications: None  Hospital course: Onset of Labor With Vaginal Delivery      19y.o. yo G2P0111 at 314w1das admitted in Latent Labor s/p SROM at home on 07/06/2020. Upon admission she was contracting every 5 minutes and her initial cervical exam was 1/70/-1.  She received Betamethasone at 1400 as well as GBS prophylaxis for unknown GBS status.  She began labor augmentation at 1650 with pitocin.  A foley bulb was inserted and came out on its own shortly after. She continued to progress and received an epidural and amniofusion for some variable decelerations. [facility] as noted below.  Patient had an uncomplicated labor course as follows:  Membrane Rupture Time/Date: 11:30 AM ,07/06/2020   Delivery Method:Vaginal, Spontaneous  Episiotomy: None  Lacerations:  1st degree;Periurethral  Patient had an uncomplicated postpartum course.  She is ambulating, tolerating a regular diet, passing flatus, and urinating well. Patient is discharged home in stable condition on 07/09/20.  Newborn Data: Birth date:07/07/2020   Birth time:12:25 AM  Gender:Female  Living status:Living  Apgars:8 ,9  [id number]   Magnesium Sulfate received: No BMZ received: Yes Rhophylac:N/A MMR:N/A T-DaP:Given prenatally Flu: N/A Transfusion:No  Physical exam  Vitals:   07/08/20 1513 07/08/20 1934 07/08/20 2152 07/09/20 0551  BP: 132/71 115/67 123/60 (!) 142/72  Pulse: 65 84 90 72  Resp: _0 Temp: 98.8 F (37.1 C) 98.3 F (36.8 C) 98.3 F (36.8 C) 98.2 F (36.8 C)  TempSrc: Oral Oral Oral Oral  SpO2: 100% 100% 100% 100%  Weight:      Height:       General: alert, cooperative and no distress Lochia: appropriate Uterine Fundus: firm and below level of umbilicus Incision: N/A DVT Evaluation: No evidence of DVT seen on physical exam. No cords or calf tenderness. No significant calf/ankle edema. Labs: Lab Results  Component Value Date   WBC 10.9 (H) 07/06/2020   HGB 12.2 07/06/2020   HCT 36.4 07/06/2020   MCV 91.9 07/06/2020   PLT 244 07/06/2020   CMP Latest Ref Rng & Units 07/06/2020  Glucose 70 - 99 mg/dL 121(H)  BUN 6 - 20 mg/dL <5(L)  Creatinine 0.44 - 1.00 mg/dL 0.61  Sodium 135 - 145 mmol/L 134(L)  Potassium 3.5 - 5.1 mmol/L 3.9  Chloride 98 - 111 mmol/L 103  CO2 22 - 32 mmol/L 21(L)  Calcium 8.9 - 10.3 mg/dL 10.1  Total Protein 6.5 - 8.1 g/dL 7.5  Total Bilirubin 0.3 -  1.2 mg/dL 0.6  Alkaline Phos 38 - 126 U/L 122  AST 15 - 41 U/L 26  ALT 0 - 44 U/L 28   Edinburgh Score: Edinburgh Postnatal Depression Scale Screening Tool 07/09/2020  I have been able to laugh and see the funny side of things. (No Data)     After visit meds:  Allergies as of 07/09/2020   No Known Allergies     Medication List    STOP taking these medications   aspirin EC 81 MG tablet   Blood Pressure Cuff Misc   butalbital-acetaminophen-caffeine 50-325-40 MG tablet Commonly known as: [place] Supp Lg Misc     TAKE these medications   acetaminophen 325 MG tablet Commonly  known as: Tylenol Take 2 tablets (650 mg total) by mouth every 6 (six) hours.   coconut oil Oil Apply 1 application topically as needed.   docusate sodium 100 MG capsule Commonly known as: COLACE Take 1 capsule (100 mg total) by mouth 2 (two) times daily.   ibuprofen 600 MG tablet Commonly known as: ADVIL Take 1 tablet (600 mg total) by mouth every 8 (eight) hours as needed.   prenatal multivitamin Tabs tablet Take 1 tablet by mouth daily at 12 noon.        Discharge home in stable condition Infant Feeding: Breast Infant Disposition:home with mother Discharge instruction: per After Visit Summary and Postpartum booklet. Activity: Advance as tolerated. Pelvic rest for 6 weeks.  Diet: routine diet Future Appointments: Future Appointments  Date Time Provider [place]  07/14/2020 10:00 AM [place] None  08/03/2020  1:00 PM Rasch, Artist Pais, NP [place] None   Follow up Visit:  Please schedule this patient for a Virtual postpartum visit in 4 weeks with the following provider: Any provider. Additional Postpartum F/U:BP check 1 week (virtual;) High risk pregnancy complicated by: HTN and IUGR Delivery mode:  Vaginal, Spontaneous  Anticipated Birth Control:  Unsure  Randa Ngo, MD OB Fellow, Faculty [facility] 07/09/2020 11:53 AM

## 2020-07-08 NOTE — Progress Notes (Signed)
Post Partum Day 1 Subjective: no complaints, up ad lib, voiding, tolerating PO and + flatus   Objective: Blood pressure 122/62, pulse 86, temperature 99.2 F (37.3 C), temperature source Oral, resp. rate 18, height 5\' 6"  (1.676 m), weight 86.2 kg, last menstrual period 08/30/2019, SpO2 100 %, unknown if currently breastfeeding.  Physical Exam:  General: alert, cooperative and appears stated age Lochia: appropriate Uterine Fundus: firm Incision: n/a DVT Evaluation: No evidence of DVT seen on physical exam.  Recent Labs    07/06/20 1352  HGB 12.2  HCT 36.4    Assessment/Plan: Plan for discharge tomorrow - after baby cleared by peds, currently under bili light Unsure contraception   LOS: 2 days   09/05/20 07/08/2020, 10:17 AM

## 2020-07-09 MED ORDER — PRENATAL MULTIVITAMIN CH: 1.0000 | ORAL_TABLET | Freq: Every day | ORAL | Status: DC

## 2020-07-09 MED ORDER — ACETAMINOPHEN 325 MG PO TABS: 650.0000 mg | ORAL_TABLET | Freq: Four times a day (QID) | ORAL | Status: DC

## 2020-07-09 MED ORDER — COCONUT OIL OIL: 1.0000 "application " | TOPICAL_OIL | 0 refills | Status: DC | PRN

## 2020-07-09 MED ORDER — DOCUSATE SODIUM 100 MG PO CAPS: 100.0000 mg | ORAL_CAPSULE | Freq: Two times a day (BID) | ORAL | 0 refills | Status: DC

## 2020-07-09 MED ORDER — IBUPROFEN 600 MG PO TABS: 600.0000 mg | ORAL_TABLET | Freq: Three times a day (TID) | ORAL | 0 refills | Status: DC | PRN

## 2020-07-09 NOTE — Discharge Instructions (Signed)
Continue to take your prenatal vitamin with breastfeeding.  Please follow-up in 1 week for a blood pressure check in clinic and in 4 weeks for your postpartum appointment. Please call sooner if concern of severe headache, vision changes, chest pain, difficulty breathing, severe abdominal pain or other concerning symptoms as noted below.  Postpartum Care After Vaginal Delivery This sheet gives you information about how to care for yourself from the time you deliver your baby to up to 6-12 weeks after delivery (postpartum period). Your health care provider may also give you more specific instructions. If you have problems or questions, contact your health care provider. Follow these instructions at home: Vaginal bleeding  It is normal to have vaginal bleeding (lochia) after delivery. Wear a sanitary pad for vaginal bleeding and discharge. ? During the first week after delivery, the amount and appearance of lochia is often similar to a menstrual period. ? Over the next few weeks, it will gradually decrease to a dry, yellow-brown discharge. ? For most women, lochia stops completely by 4-6 weeks after delivery. Vaginal bleeding can vary from woman to woman.  Change your sanitary pads frequently. Watch for any changes in your flow, such as: ? A sudden increase in volume. ? A change in color. ? Large blood clots.  If you pass a blood clot from your vagina, save it and call your health care provider to discuss. Do not flush blood clots down the toilet before talking with your health care provider.  Do not use tampons or douches until your health care provider says this is safe.  If you are not breastfeeding, your period should return 6-8 weeks after delivery. If you are feeding your child breast milk only (exclusive breastfeeding), your period may not return until you stop breastfeeding. Perineal care  Keep the area between the vagina and the anus (perineum) clean and dry as told by your health  care provider. Use medicated pads and pain-relieving sprays and creams as directed.  If you had a cut in the perineum (episiotomy) or a tear in the vagina, check the area for signs of infection until you are healed. Check for: ? More redness, swelling, or pain. ? Fluid or blood coming from the cut or tear. ? Warmth. ? Pus or a bad smell.  You may be given a squirt bottle to use instead of wiping to clean the perineum area after you go to the bathroom. As you start healing, you may use the squirt bottle before wiping yourself. Make sure to wipe gently.  To relieve pain caused by an episiotomy, a tear in the vagina, or swollen veins in the anus (hemorrhoids), try taking a warm sitz bath 2-3 times a day. A sitz bath is a warm water bath that is taken while you are sitting down. The water should only come up to your hips and should cover your buttocks. Breast care  Within the first few days after delivery, your breasts may feel heavy, full, and uncomfortable (breast engorgement). Milk may also leak from your breasts. Your health care provider can suggest ways to help relieve the discomfort. Breast engorgement should go away within a few days.  If you are breastfeeding: ? Wear a bra that supports your breasts and fits you well. ? Keep your nipples clean and dry. Apply creams and ointments as told by your health care provider. ? You may need to use breast pads to absorb milk that leaks from your breasts. ? You may have uterine contractions every  time you breastfeed for up to several weeks after delivery. Uterine contractions help your uterus return to its normal size. ? If you have any problems with breastfeeding, work with your health care provider or Advertising copywriter.  If you are not breastfeeding: ? Avoid touching your breasts a lot. Doing this can make your breasts produce more milk. ? Wear a good-fitting bra and use cold packs to help with swelling. ? Do not squeeze out (express) milk.  This causes you to make more milk. Intimacy and sexuality  Ask your health care provider when you can engage in sexual activity. This may depend on: ? Your risk of infection. ? How fast you are healing. ? Your comfort and desire to engage in sexual activity.  You are able to get pregnant after delivery, even if you have not had your period. If desired, talk with your health care provider about methods of birth control (contraception). Medicines  Take over-the-counter and prescription medicines only as told by your health care provider.  If you were prescribed an antibiotic medicine, take it as told by your health care provider. Do not stop taking the antibiotic even if you start to feel better. Activity  Gradually return to your normal activities as told by your health care provider. Ask your health care provider what activities are safe for you.  Rest as much as possible. Try to rest or take a nap while your baby is sleeping. Eating and drinking   Drink enough fluid to keep your urine pale yellow.  Eat high-fiber foods every day. These may help prevent or relieve constipation. High-fiber foods include: ? Whole grain cereals and breads. ? Brown rice. ? Beans. ? Fresh fruits and vegetables.  Do not try to lose weight quickly by cutting back on calories.  Take your prenatal vitamins until your postpartum checkup or until your health care provider tells you it is okay to stop. Lifestyle  Do not use any products that contain nicotine or tobacco, such as cigarettes and e-cigarettes. If you need help quitting, ask your health care provider.  Do not drink alcohol, especially if you are breastfeeding. General instructions  Keep all follow-up visits for you and your baby as told by your health care provider. Most women visit their health care provider for a postpartum checkup within the first 3-6 weeks after delivery. Contact a health care provider if:  You feel unable to cope with  the changes that your child brings to your life, and these feelings do not go away.  You feel unusually sad or worried.  Your breasts become red, painful, or hard.  You have a fever.  You have trouble holding urine or keeping urine from leaking.  You have little or no interest in activities you used to enjoy.  You have not breastfed at all and you have not had a menstrual period for 12 weeks after delivery.  You have stopped breastfeeding and you have not had a menstrual period for 12 weeks after you stopped breastfeeding.  You have questions about caring for yourself or your baby.  You pass a blood clot from your vagina. Get help right away if:  You have chest pain.  You have difficulty breathing.  You have sudden, severe leg pain.  You have severe pain or cramping in your lower abdomen.  You bleed from your vagina so much that you fill more than one sanitary pad in one hour. Bleeding should not be heavier than your heaviest period.  You  develop a severe headache.  You faint.  You have blurred vision or spots in your vision.  You have bad-smelling vaginal discharge.  You have thoughts about hurting yourself or your baby. If you ever feel like you may hurt yourself or others, or have thoughts about taking your own life, get help right away. You can go to the nearest emergency department or call:  Your local emergency services (911 in the [place]  A suicide crisis helpline, such as the National Suicide Prevention Lifeline at 430-132-2232. This is open 24 hours a day. Summary  The period of time right after you deliver your newborn up to 6-12 weeks after delivery is called the postpartum period.  Gradually return to your normal activities as told by your health care provider.  Keep all follow-up visits for you and your baby as told by your health care provider. This information is not intended to replace advice given to you by your health care provider. Make sure  you discuss any questions you have with your health care provider. Document Revised: 11/21/2017 Document Reviewed: 09/01/2017 Elsevier Patient Education  2020 [employer].

## 2020-07-09 NOTE — Lactation Note (Signed)
This note was copied from a baby's chart. Lactation Consultation Note  Patient Name: Alicia Black [id number] Date: 07/09/2020 Reason for consult: Follow-up assessment;1st time breastfeeding;Late-preterm 34-36.6wks;Infant < 6lbs  Infant is a 36 week 60 hours old. Weight loss 3%. Mom is currently bottle feeding with Similac Neosure 22 cal/oz last feed at 11 am total of 30 ml given. Reviewed hand expression with the Mom and gave infant a few drops of colostrum via spoon. Mom states she feels uncomfortable with pumping but denies pain. Assisted the Mom with DEBP with a 24 flange and adjusted the initiation phase to her comfort. Mom does have WIC out of [facility]. Explained to Mom she could get a DEBP for home, she states she is not ready at this time and is unsure about using the electric pump consistently. Mom is aware upon d/c she can contact [facility] for a pump if she changes her mind.   Mom's nipples are flat and large. As a result, we instructed the Mom to use her manual pump for 10 to 15 minutes a few times a day to help elongate her nipples and increase stimulation for her milk supply. Breast shells were given to for her to use to also help using reverse pressure softening to elongate her nipples.   Education provided on feeding cues, engorgement, monitoring I/O's, and formula volumes for the LPTI. Feeding guidelines reviewed and Mom was able to verbalize feeding frequency 8 to 12 x in 24 hour period.   Mom has a follow up appointment with Peds on Monday. [facility] brochure provided for with information on breastfeeding support classes and outpatient services.         Interventions Interventions: Skin to skin;Hand express;Reverse pressure;Breast compression;Coconut oil;Shells;Hand pump;DEBP  Lactation Tools Discussed/Used WIC Program: Yes   Consult Status Consult Status: Complete Date: 07/09/20 Follow-up type: In-patient    Darlene Brozowski  Nicholson-Springer 07/09/2020, 12:27 PM

## 2020-07-10 ENCOUNTER — Encounter: Payer: Medicaid Other | Admitting: Obstetrics & Gynecology

## 2020-07-10 ENCOUNTER — Telehealth: Payer: Self-pay | Admitting: *Deleted

## 2020-07-10 LAB — SURGICAL PATHOLOGY

## 2020-07-10 NOTE — Telephone Encounter (Signed)
Contacted pt to com   Transition Care Management Follow-up Telephone Call  . Medicaid Managed Care Transition Call Status:MM [facility] Call Made  . Date of discharge and from where: [facility], 07/09/20  . How have you been since you were released from the hospital?"fine"  . Any questions or concerns? No  Items Reviewed: Marland Kitchen Did the pt receive and understand the discharge instructions provided? Yes  . Medications obtained and verified? Yes  . Any new allergies since your discharge? No  . Dietary orders reviewed? Yes . Do you have support at home? Yes, family  Functional Questionnaire: (I = Independent and D = Dependent)  ADLs: Independent Bathing/Dressing:Independent Meal Prep: Independent Eating: Independent Maintaining continence: Independent Transferring/Ambulation: Independent Managing Meds: Independent   Follow up appointments reviewed:  PCP Hospital f/u appt confirmed? No  S pt would like to be assigned a PCP  Specialist Hospital f/u appt confirmed?  Scheduled to see RN at [facility] on 07/14/20 @ 1000; and Venia Carbon, NP at [facility] on 08/03/20 at 0900   Are transportation arrangements needed? No   If their condition worsens, is the pt aware to call PCP or go to the EmergencyDept.? yes Was the patient provided with contact information for the PCP's office or ED? yes  Was to pt encouraged to call back with questions or concerns? yes  Burnard Bunting, RN, BSN, CCRN Patient Engagement Center [phone]

## 2020-07-13 ENCOUNTER — Ambulatory Visit: Payer: Medicaid Other

## 2020-07-13 ENCOUNTER — Encounter: Payer: Medicaid Other | Admitting: Family Medicine

## 2020-07-14 ENCOUNTER — Other Ambulatory Visit: Payer: Self-pay

## 2020-07-14 ENCOUNTER — Ambulatory Visit: Payer: Medicaid Other

## 2020-07-14 VITALS — BP 121/77 | HR 93

## 2020-07-14 DIAGNOSIS — Z0130 Encounter for examination of blood pressure without abnormal findings: Secondary | ICD-10-CM

## 2020-07-14 NOTE — Progress Notes (Signed)
Patient presents for Blood Pressure Check. Vaginal Delivery at 36 wks on 07/07/20 Pt denies any HA's, no swelling, no visual changes. Pt not currently on any B/P medication . ASSESSMENT: Consulted with available provider in the office Blood Pressure WNL   PLAN: Patient to return to office for PP appt. Pt made aware if any HA's,severe swelling or visual changes to report to [facility]U. Pt agreeable and voiced understanding.

## 2020-07-15 NOTE — Progress Notes (Signed)
I reviewed the nurses note and agree with the plan of care.   Kyrian Stage A, MD 02/27/2018 10:46 AM  

## 2020-08-03 ENCOUNTER — Encounter: Payer: Self-pay | Admitting: Obstetrics and Gynecology

## 2020-08-03 ENCOUNTER — Ambulatory Visit (INDEPENDENT_AMBULATORY_CARE_PROVIDER_SITE_OTHER): Payer: Medicaid Other | Admitting: Obstetrics and Gynecology

## 2020-08-03 ENCOUNTER — Other Ambulatory Visit: Payer: Self-pay

## 2020-08-03 NOTE — Progress Notes (Signed)
Post Partum Visit Note  Alicia Black is a 19 y.o. [phone] female who presents for a postpartum visit. She is 4 weeks postpartum following a normal spontaneous vaginal delivery.  I have fully reviewed the prenatal and intrapartum course. The delivery was at 36 gestational weeks.  Anesthesia: epidural. Postpartum course has been uncomplicated. Baby is doing well.  Baby is feeding by bottle - Similac Neosure. Bleeding no bleeding. Bowel function is normal. Bladder function is normal. Patient is not sexually active. Contraception method is abstinence. Postpartum depression screening: negative. FOB not involved.   The pregnancy intention screening data noted above was reviewed. Potential methods of contraception were discussed. The patient elected to proceed with Abstinence.    Edinburgh Postnatal Depression Scale - 08/03/20 1314      Edinburgh Postnatal Depression Scale:  In the Past 7 Days   I have been able to laugh and see the funny side of things. 0    I have looked forward with enjoyment to things. 0    I have blamed myself unnecessarily when things went wrong. 0    I have been anxious or worried for no good reason. 0    I have felt scared or panicky for no good reason. 0    Things have been getting on top of me. 0    I have been so unhappy that I have had difficulty sleeping. 0    I have felt sad or miserable. 0    I have been so unhappy that I have been crying. 0    The thought of harming myself has occurred to me. 0    Edinburgh Postnatal Depression Scale Total 0            The following portions of the patient's history were reviewed and updated as appropriate: allergies, current medications, past family history, past medical history, past social history, past surgical history and problem list.  Review of Systems Pertinent items are noted in HPI.    Objective:  Blood pressure 114/73, pulse 89, weight 178 lb (80.7 kg), last menstrual period 08/30/2019, not currently  breastfeeding.  General:  alert and cooperative  Lungs: clear to auscultation bilaterally  Heart:  regular rate and rhythm, S1, S2 normal, no murmur, click, rub or gallop  Abdomen: soft, non-tender; bowel sounds normal; no masses,  no organomegaly        Assessment:    Normal postpartum exam. Pap smear not done at today's visit.   Plan:   Essential components of care per ACOG recommendations:  1.  Mood and well being: Patient with negative depression screening today. Reviewed local resources for support.  - Patient does not use tobacco. If using tobacco we discussed reduction and for recently cessation risk of relapse - hx of drug use? No   If yes, discussed support systems  2. Infant care and feeding:  -Patient currently breastmilk feeding? No If breastmilk feeding discussed return to work and pumping. If needed, patient was provided letter for work to allow for every 2-3 hr pumping breaks, and to be granted a private location to express breastmilk and refrigerated area to store breastmilk. Reviewed importance of draining breast regularly to support lactation. -Social determinants of health (SDOH) reviewed in EPIC.   3. Sexuality, contraception and birth spacing - Patient does not want a pregnancy in the next year.  Desired family size is 1 children.  - Reviewed forms of contraception in tiered fashion. Patient desired no method today.   -  Discussed birth spacing of 18 months  4. Sleep and fatigue -Encouraged family/partner/community support of 4 hrs of uninterrupted sleep to help with mood and fatigue  5. Physical Recovery  - Discussed patients delivery and complications - Patient had a 2 bilateral upper labial laceration, perineal healing reviewed. Patient expressed understanding - Patient has urinary incontinence? No Patient was referred to pelvic floor PT  - Patient is safe to resume physical and sexual activity  6.  Health Maintenance - Last pap smear done: N/A  7.  Chronic Disease - PCP follow up  Venia Carbon, NP Center for [facility], [facility]

## 2020-09-26 IMAGING — US US MFM OB FOLLOW-UP
1 series · 13 of 28 positions shown · non-contrast
Comparison: none

[Series 1: us mfm ob follow-up · 110 acquisitions, 13 frames shown]
[im 5/110]
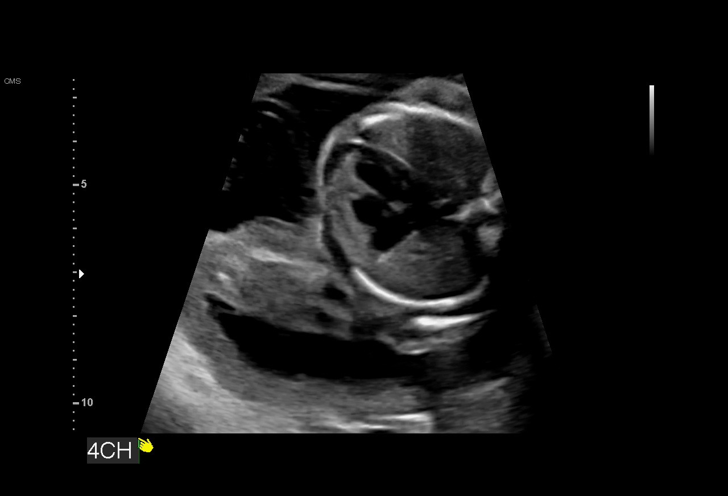
[im 13/110]
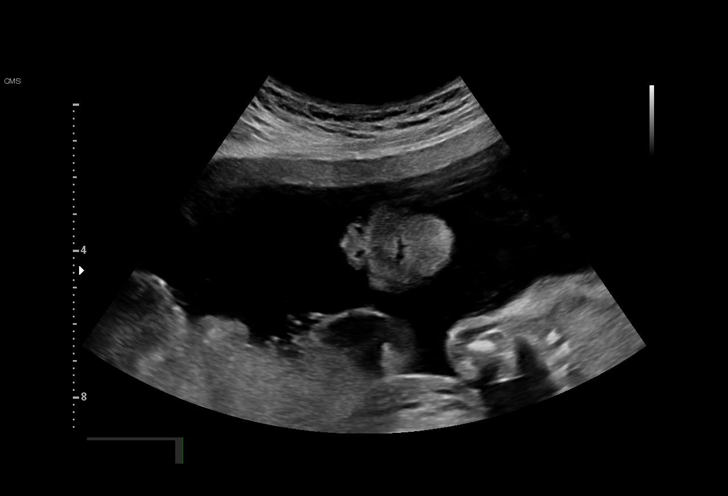
[im 21/110]
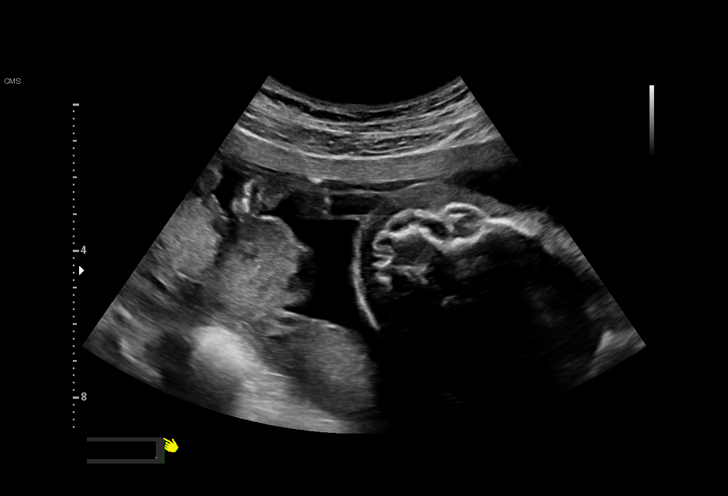
[im 29/110]
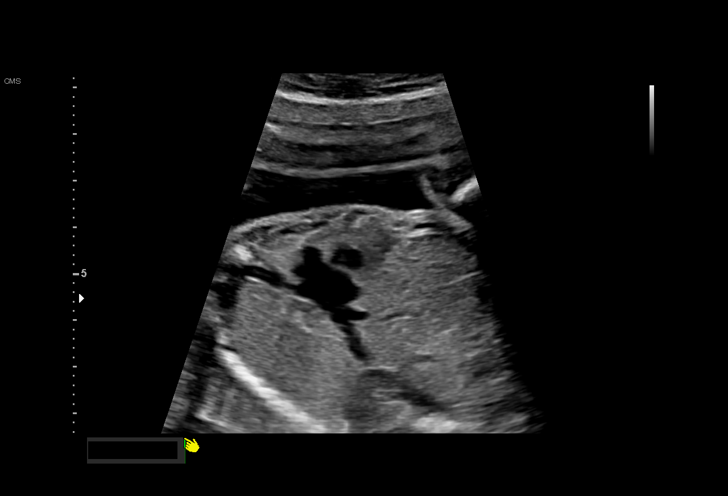
[im 37/110]
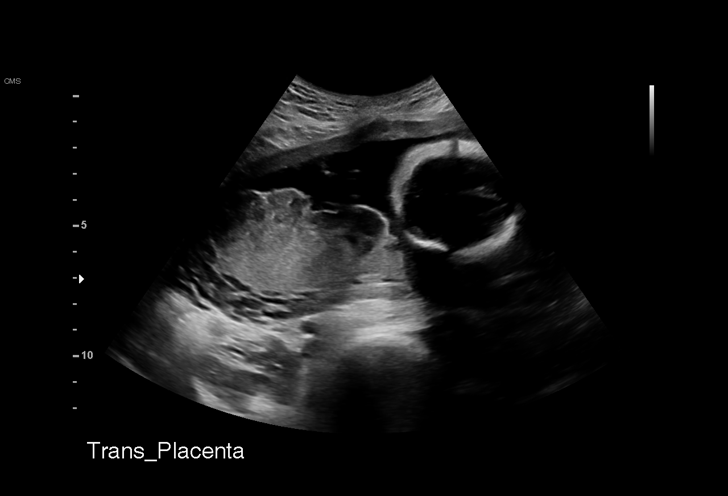
[im 45/110]
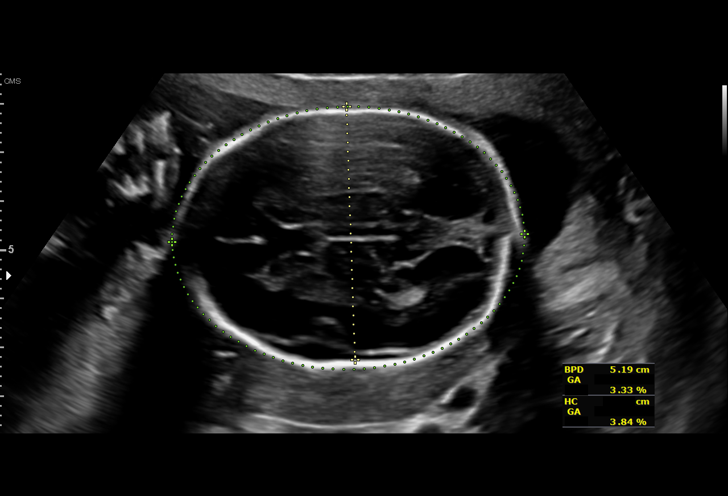
[im 57/110]
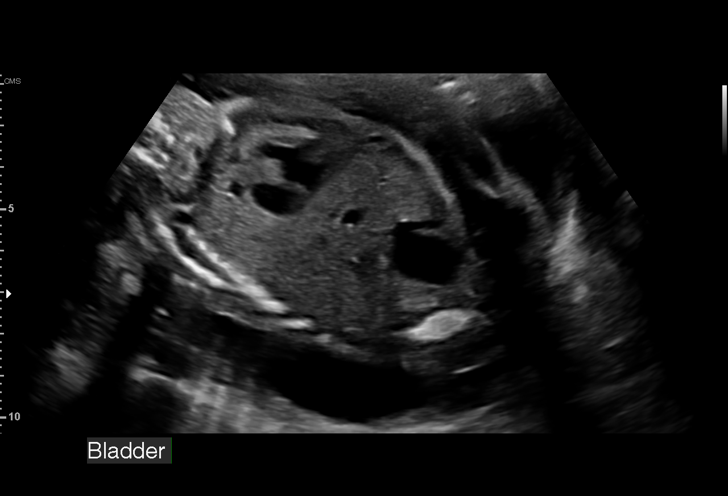
[im 65/110]
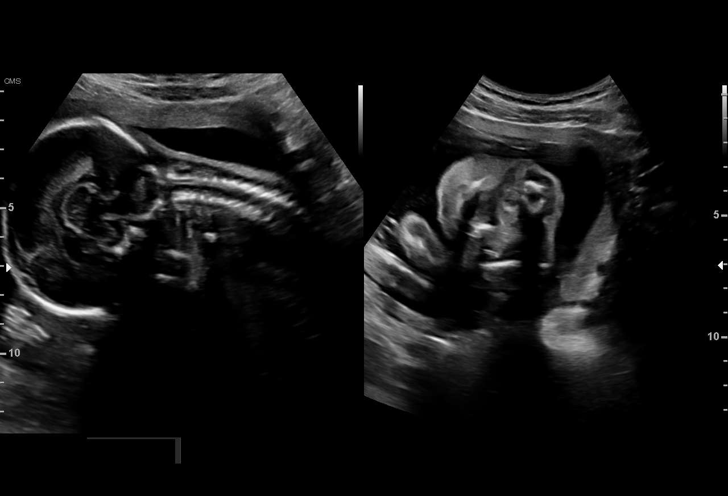
[im 73/110]
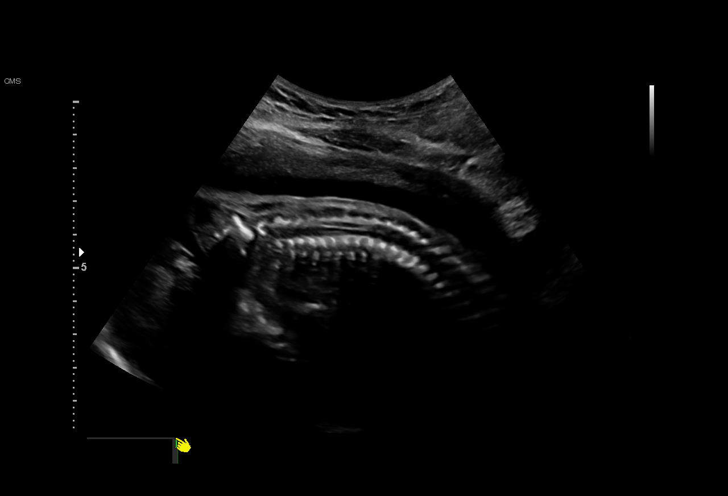
[im 81/110]
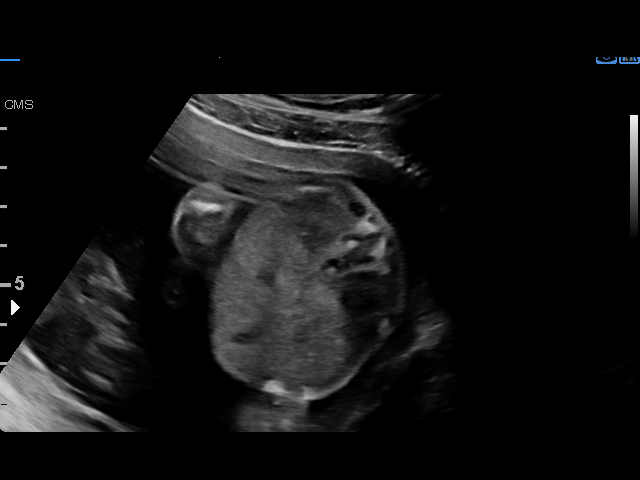
[im 89/110]
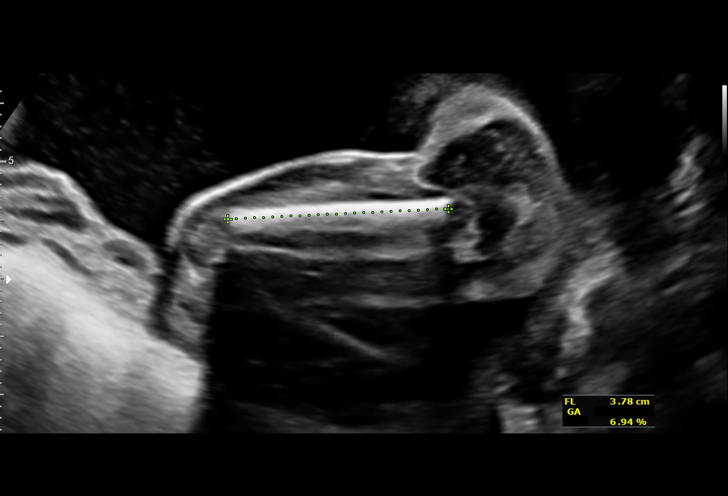
[im 97/110]
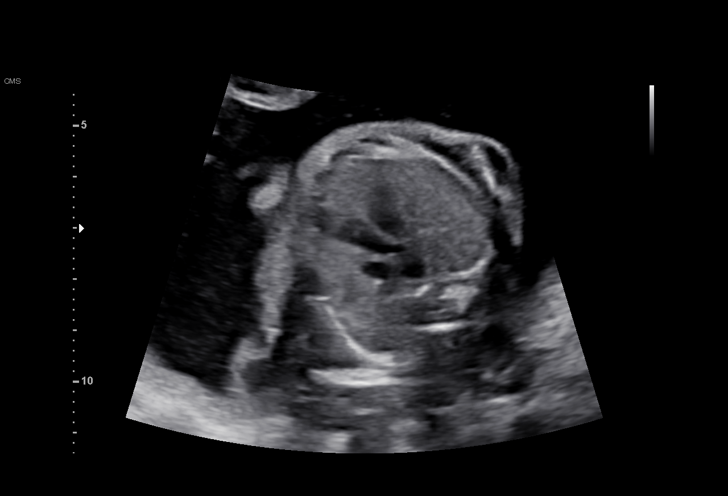
[im 105/110]
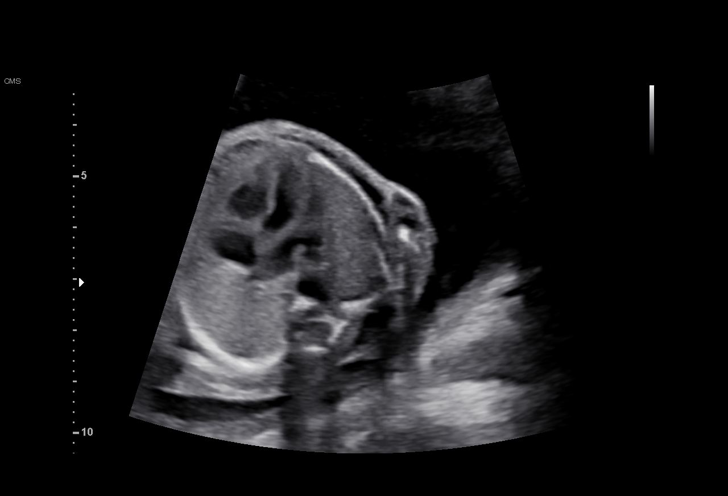

[13 of 28 positions shown; findings below may reference images not displayed]

Indications

 Antenatal follow-up for nonvisualized fetal
 anatomy
 23 weeks gestation of pregnancy
 History of sickle cell trait
 Abdominal pain in pregnancy
 Low Risk NIPS
Fetal Evaluation

 Num Of Fetuses:         1
 Fetal Heart Rate(bpm):  159
 Cardiac Activity:       Observed
 Presentation:           Variable
 Placenta:               Posterior
 P. Cord Insertion:      Visualized

 Amniotic Fluid
 AFI FV:      Within normal limits

                             Largest Pocket(cm)

Biometry

 BPD:        52  mm     G. Age:  21w 5d        3.5  %    CI:        68.11   %    70 - 86
                                                         FL/HC:      18.9   %    19.2 -
 HC:      201.5  mm     G. Age:  22w 2d          5  %    HC/AC:      1.07        1.05 -
 AC:      188.6  mm     G. Age:  23w 4d         48  %    FL/BPD:     73.3   %    71 - 87
 FL:       38.1  mm     G. Age:  22w 1d          8  %    FL/AC:      20.2   %    20 - 24
 HUM:      36.7  mm     G. Age:  22w 6d         29  %
 CER:      24.8  mm     G. Age:  22w 6d         39  %

 Est. FW:     542  gm      1 lb 3 oz     19  %
OB History

 Gravidity:    2          SAB:   1
Gestational Age

 LMP:           31w 4d        Date:  08/30/19                 EDD:   06/05/20
 U/S Today:     22w 3d                                        EDD:   08/08/20
 Best:          23w 3d     Det. By:  Early Ultrasound         EDD:   08/01/20
                                     (01/10/20)
Anatomy

 Cranium:               Appears normal         LVOT:                   Appears normal
 Cavum:                 Appears normal         Aortic Arch:            Appears normal
 Ventricles:            Appears normal         Ductal Arch:            Appears normal
 Choroid Plexus:        Appears normal         Diaphragm:              Appears normal
 Cerebellum:            Appears normal         Stomach:                Appears normal, left
                                                                       sided
 Posterior Fossa:       Appears normal         Abdomen:                Appears normal
 Nuchal Fold:           Not applicable (>20    Abdominal Wall:         Appears nml (cord
                        wks GA)                                        insert, abd wall)
 Face:                  Appears normal         Cord Vessels:           Appears normal (3
                        (orbits and profile)                           vessel cord)
 Lips:                  Appears normal         Kidneys:                Appear normal
 Palate:                Appears normal         Bladder:                Appears normal
 Thoracic:              Appears normal         Spine:                  Appears normal
 Heart:                 Appears normal         Upper Extremities:      Previously seen
                        (4CH, axis, and
                        situs)
 RVOT:                  Appears normal         Lower Extremities:      Previously seen

 Other:  Heels and 5th digits visualized.  Technically difficult due to fetal
         position.
Cervix Uterus Adnexa

 Uterus
 No abnormality visualized.

 Right Ovary
 Within normal limits.

 Left Ovary
 Within normal limits.

 Cul De Sac
 No free fluid seen.

 Adnexa
 No abnormality visualized.
Comments

 This patient was seen for a follow up exam as the fetal
 cardiac views were unable to be fully visualized during her
 prior exam.  She denies any problems since her last exam.
 She was informed that the fetal growth and amniotic fluid
 level appears appropriate for her gestational age.
 The views of the fetal heart were visualized today.  There
 were no obvious anomalies suspected.  The limitations of
 ultrasound in the detection of all anomalies was discussed.
 As the overall EFW measures in the lower normal range
 today, a follow-up growth scan was scheduled in 4 weeks.

## 2020-09-28 ENCOUNTER — Encounter: Payer: Self-pay | Admitting: Medical

## 2020-09-28 ENCOUNTER — Telehealth (INDEPENDENT_AMBULATORY_CARE_PROVIDER_SITE_OTHER): Payer: Medicaid Other | Admitting: Medical

## 2020-09-28 DIAGNOSIS — Z30011 Encounter for initial prescription of contraceptive pills: Secondary | ICD-10-CM | POA: Diagnosis not present

## 2020-09-28 DIAGNOSIS — Z3009 Encounter for other general counseling and advice on contraception: Secondary | ICD-10-CM | POA: Diagnosis not present

## 2020-09-28 MED ORDER — NORGESTIMATE-ETH ESTRADIOL 0.25-35 MG-MCG PO TABS: 1.0000 | ORAL_TABLET | Freq: Every day | ORAL | 11 refills | Status: DC

## 2020-09-28 NOTE — Progress Notes (Signed)
  I connected with Alicia Black at 9:22 AM on 09/28/2020 by: MyChart video and verified that I am speaking with the correct person using two identifiers.  Patient is located at home and provider is located at [facility].     The purpose of this virtual visit is to provide medical care while limiting exposure to the novel coronavirus. I discussed the limitations, risks, security and privacy concerns of performing an evaluation and management service by MyChart video and the availability of in person appointments. I also discussed with the patient that there may be a patient responsible charge related to this service. By engaging in this virtual visit, you consent to the provision of healthcare.  Additionally, you authorize for your insurance to be billed for the services provided during this visit.  The patient expressed understanding and agreed to proceed.    History:  [name] is a 19 y.o. [phone] who presents to clinic today for birth control counseling. Patient would like to start OCPs, She denies any history of HTN, she is not obese, she does smoke, but states that tobacco usage is minimal and she will try to quite. She is not currently breastfeeding.    The following portions of the patient's history were reviewed and updated as appropriate: allergies, current medications, family history, past medical history, social history, past surgical history and problem list.  Review of Systems:  Review of Systems  Gastrointestinal: Negative for diarrhea.  Genitourinary:       Neg - vaginal bleeding      Objective:  Physical Exam LMP 09/23/2020 (Exact Date)  Physical Exam Nursing note reviewed.  Constitutional:      Appearance: Normal appearance.  Pulmonary:     Effort: Pulmonary effort is normal.  Neurological:     Mental Status: She is alert and oriented to person, place, and time.  Psychiatric:        Mood and Affect: Mood normal.   The rest of the exam is deferred due to the  nature of the visit type.  Assessment & Plan:  1. Birth control counseling  2. Unwanted fertility - norgestimate-ethinyl estradiol (ORTHO-CYCLEN) 0.25-35 MG-MCG tablet; Take 1 tablet by mouth daily.  Dispense: 28 tablet; Refill: 11 - Advised to use condoms as a back-up until first month of pills is complete - Advised to take at the same time everyday. If pill is missed should be taken as soon as possible, even if taking two pills at the same time. Use back-up method when this occurs  - Call for annual exam with pap smear when 2 refills are left   Approximately 20 minutes of total time was spent with this patient on history taking, patient education and documentation.   Marny Lowenstein, PA-C 09/28/2020 9:21 AM

## 2020-09-28 NOTE — Progress Notes (Addendum)
I connected with  Alicia Black on 09/28/20 by a video enabled telemedicine application and verified that I am speaking with the correct person using two identifiers.  Patient is at home Provider is at work   I discussed the limitations of evaluation and management by telemedicine. The patient expressed understanding and agreed to proceed.  [facility] to consult for [facility] she wants OCP.  She is sexually active, last encounter was 3 weeks ago.  LMP 09/23/2020

## 2020-09-28 NOTE — Patient Instructions (Signed)

## 2020-10-04 ENCOUNTER — Other Ambulatory Visit (HOSPITAL_COMMUNITY)
Admission: RE | Admit: 2020-10-04 | Discharge: 2020-10-04 | Disposition: A | Discharge status: Home / Self Care | Payer: Medicaid Other | Source: Ambulatory Visit | Attending: Obstetrics | Admitting: Obstetrics

## 2020-10-04 ENCOUNTER — Other Ambulatory Visit: Payer: Self-pay

## 2020-10-04 ENCOUNTER — Ambulatory Visit (INDEPENDENT_AMBULATORY_CARE_PROVIDER_SITE_OTHER): Payer: Medicaid Other

## 2020-10-04 DIAGNOSIS — R1030 Lower abdominal pain, unspecified: Secondary | ICD-10-CM

## 2020-10-04 DIAGNOSIS — Z113 Encounter for screening for infections with a predominantly sexual mode of transmission: Secondary | ICD-10-CM | POA: Insufficient documentation

## 2020-10-04 DIAGNOSIS — N898 Other specified noninflammatory disorders of vagina: Secondary | ICD-10-CM | POA: Diagnosis not present

## 2020-10-04 LAB — POCT URINALYSIS DIPSTICK
Bilirubin, UA: NEGATIVE
Blood, UA: NEGATIVE
Glucose, UA: NEGATIVE
Ketones, UA: NEGATIVE
Leukocytes, UA: NEGATIVE
Nitrite, UA: NEGATIVE
Odor: NEGATIVE
Protein, UA: NEGATIVE
Spec Grav, UA: 1.015 (ref 1.010–1.025)
Urobilinogen, UA: 0.2 E.U./dL
pH, UA: 7 (ref 5.0–8.0)

## 2020-10-04 LAB — POCT URINE PREGNANCY: Preg Test, Ur: NEGATIVE

## 2020-10-04 NOTE — Progress Notes (Signed)
Agree with A & P. 

## 2020-10-04 NOTE — Progress Notes (Signed)
SUBJECTIVE:  19 y.o. female complains of white vaginal discharge, requests vaginal STD testing, and requests UPT d/t low abdominal pain. Denies abnormal vaginal bleeding or fever. No UTI symptoms. Denies history of known exposure to STD.  Patient's last menstrual period was 09/23/2020 (exact date).  OBJECTIVE:  She appears well, afebrile. Urine dipstick: negative for all components. UPT - negative   ASSESSMENT:  Vaginal Discharge  Vaginal Odor   PLAN:  Continue BCP daily if pregnancy is not desired at this time  GC, chlamydia, trichomonas, BVAG, CVAG probe sent to lab. Treatment: To be determined once lab results are reviewed by the provider.  ROV prn if symptoms persist or worsen.

## 2020-10-05 LAB — CERVICOVAGINAL ANCILLARY ONLY
Bacterial Vaginitis (gardnerella): POSITIVE — AB
Candida Glabrata: NEGATIVE
Candida Vaginitis: NEGATIVE
Chlamydia: NEGATIVE
Comment: NEGATIVE
Comment: NEGATIVE
Comment: NEGATIVE
Comment: NEGATIVE
Comment: NEGATIVE
Comment: NORMAL
Neisseria Gonorrhea: NEGATIVE
Trichomonas: NEGATIVE

## 2020-10-07 ENCOUNTER — Other Ambulatory Visit (HOSPITAL_COMMUNITY): Payer: Self-pay | Admitting: Obstetrics & Gynecology

## 2020-10-07 DIAGNOSIS — N898 Other specified noninflammatory disorders of vagina: Secondary | ICD-10-CM

## 2020-10-07 MED ORDER — METRONIDAZOLE 500 MG PO TABS: 500.0000 mg | ORAL_TABLET | Freq: Two times a day (BID) | ORAL | 0 refills | Status: AC

## 2020-10-07 NOTE — Progress Notes (Signed)
Meds ordered this encounter  Medications  . metroNIDAZOLE (FLAGYL) 500 MG tablet    Sig: Take 1 tablet (500 mg total) by mouth 2 (two) times daily for 7 days.    Dispense:  14 tablet    Refill:  0    Positive for BV

## 2020-10-19 ENCOUNTER — Other Ambulatory Visit: Payer: Self-pay

## 2020-10-19 ENCOUNTER — Encounter: Payer: Self-pay | Admitting: Obstetrics

## 2020-10-19 ENCOUNTER — Ambulatory Visit (INDEPENDENT_AMBULATORY_CARE_PROVIDER_SITE_OTHER): Payer: Medicaid Other | Admitting: Obstetrics

## 2020-10-19 ENCOUNTER — Other Ambulatory Visit (HOSPITAL_COMMUNITY)
Admission: RE | Admit: 2020-10-19 | Discharge: 2020-10-19 | Disposition: A | Discharge status: Home / Self Care | Payer: Medicaid Other | Source: Ambulatory Visit | Attending: Obstetrics | Admitting: Obstetrics

## 2020-10-19 VITALS — BP 146/74 | HR 70 | Wt 177.0 lb

## 2020-10-19 DIAGNOSIS — N898 Other specified noninflammatory disorders of vagina: Secondary | ICD-10-CM | POA: Insufficient documentation

## 2020-10-19 DIAGNOSIS — B3731 Acute candidiasis of vulva and vagina: Secondary | ICD-10-CM

## 2020-10-19 DIAGNOSIS — B373 Candidiasis of vulva and vagina: Secondary | ICD-10-CM

## 2020-10-19 MED ORDER — FLUCONAZOLE 150 MG PO TABS: 150.0000 mg | ORAL_TABLET | Freq: Once | ORAL | 0 refills | Status: AC

## 2020-10-19 NOTE — Progress Notes (Signed)
Patient ID: Alicia Black, female   DOB: 12-16-2000, 19 y.o.   MRN: [id number]  Chief Complaint  Patient presents with  . Vaginal Discharge    HPI Alicia Black is a 19 y.o. female.  Complains of curdy yellowish-white vaginal discharge with itching. HPI  Past Medical History:  Diagnosis Date  . Sickle cell trait [facility])     Past Surgical History:  Procedure Laterality Date  . NO PAST SURGERIES      Family History  Problem Relation Age of Onset  . Asthma Father   . Asthma Sister     Social History Social History   Tobacco Use  . Smoking status: Former Smoker    Types: Cigars  . Smokeless tobacco: Never Used  Vaping Use  . Vaping Use: Former  Substance Use Topics  . Alcohol use: Never  . Drug use: Not Currently    Types: Marijuana    No Known Allergies  Current Outpatient Medications  Medication Sig Dispense Refill  . norgestimate-ethinyl estradiol (ORTHO-CYCLEN) 0.25-35 MG-MCG tablet Take 1 tablet by mouth daily. 28 tablet 11  . acetaminophen (TYLENOL) 325 MG tablet Take 2 tablets (650 mg total) by mouth every 6 (six) hours. (Patient not taking: Reported on 08/03/2020)    . coconut oil OIL Apply 1 application topically as needed. (Patient not taking: Reported on 07/14/2020)  0  . docusate sodium (COLACE) 100 MG capsule Take 1 capsule (100 mg total) by mouth 2 (two) times daily. (Patient not taking: Reported on 07/14/2020) 10 capsule 0  . fluconazole (DIFLUCAN) 150 MG tablet Take 1 tablet (150 mg total) by mouth once for 1 dose. 1 tablet 0  . ibuprofen (ADVIL) 600 MG tablet Take 1 tablet (600 mg total) by mouth every 8 (eight) hours as needed. (Patient not taking: Reported on 07/14/2020) 30 tablet 0  . Prenatal Vit-Fe Fumarate-FA (PRENATAL MULTIVITAMIN) TABS tablet Take 1 tablet by mouth daily at 12 noon. (Patient not taking: Reported on 08/03/2020)     No current facility-administered medications for this visit.    Review of Systems Review of Systems Constitutional:  negative for fatigue and weight loss Respiratory: negative for cough and wheezing Cardiovascular: negative for chest pain, fatigue and palpitations Gastrointestinal: negative for abdominal pain and change in bowel habits Genitourinary: positive for vaginal discharge with itching Integument/breast: negative for nipple discharge Musculoskeletal:negative for myalgias Neurological: negative for gait problems and tremors Behavioral/Psych: negative for abusive relationship, depression Endocrine: negative for temperature intolerance      Blood pressure (!) 146/74, pulse 70, weight 177 lb (80.3 kg), last menstrual period 09/23/2020, not currently breastfeeding.  Physical Exam Physical Exam           General: Alert and no distress Abdomen:  normal findings: no organomegaly, soft, non-tender and no hernia  Pelvis:  External genitalia: normal general appearance Urinary system: urethral meatus normal and bladder without fullness, nontender Vaginal: normal without tenderness, induration or masses Cervix: normal appearance Adnexa: normal bimanual exam Uterus: anteverted and non-tender, normal size    50% of 15 min visit spent on counseling and coordination of care.   Data Reviewed Wet Prep and cultures  Assessment       1. Vaginal discharge Rx: - Cervicovaginal ancillary only( [facility])  2. Candida vaginitis Rx: - fluconazole (DIFLUCAN) 150 MG tablet; Take 1 tablet (150 mg total) by mouth once for 1 dose.  Dispense: 1 tablet; Refill: 0  Plan   Follow up prn   Meds ordered this encounter  Medications  .  fluconazole (DIFLUCAN) 150 MG tablet    Sig: Take 1 tablet (150 mg total) by mouth once for 1 dose.    Dispense:  1 tablet    Refill:  0     Brock Bad, MD 10/19/2020 8:58 AM

## 2020-10-19 NOTE — Progress Notes (Signed)
RGYN presents for problem visit   CC: vaginal discharge and irritation Pt notes skin peeling also.pt was scheduled for self swab however said she had asked to see a provider.

## 2020-10-20 LAB — CERVICOVAGINAL ANCILLARY ONLY
Bacterial Vaginitis (gardnerella): POSITIVE — AB
Candida Glabrata: NEGATIVE
Candida Vaginitis: POSITIVE — AB
Chlamydia: NEGATIVE
Comment: NEGATIVE
Comment: NEGATIVE
Comment: NEGATIVE
Comment: NEGATIVE
Comment: NEGATIVE
Comment: NORMAL
Neisseria Gonorrhea: NEGATIVE
Trichomonas: NEGATIVE

## 2020-10-21 ENCOUNTER — Other Ambulatory Visit: Payer: Self-pay | Admitting: Obstetrics

## 2020-10-21 DIAGNOSIS — B3731 Acute candidiasis of vulva and vagina: Secondary | ICD-10-CM

## 2020-10-21 DIAGNOSIS — B373 Candidiasis of vulva and vagina: Secondary | ICD-10-CM

## 2020-10-21 DIAGNOSIS — B9689 Other specified bacterial agents as the cause of diseases classified elsewhere: Secondary | ICD-10-CM

## 2020-10-21 DIAGNOSIS — N760 Acute vaginitis: Secondary | ICD-10-CM

## 2020-10-21 MED ORDER — FLUCONAZOLE 150 MG PO TABS: 150.0000 mg | ORAL_TABLET | Freq: Once | ORAL | 0 refills | Status: AC

## 2020-10-21 MED ORDER — METRONIDAZOLE 500 MG PO TABS: 500.0000 mg | ORAL_TABLET | Freq: Two times a day (BID) | ORAL | 2 refills | Status: DC

## 2020-11-13 ENCOUNTER — Telehealth: Payer: Self-pay

## 2020-11-13 MED ORDER — FLUCONAZOLE 150 MG PO TABS: 150.0000 mg | ORAL_TABLET | Freq: Once | ORAL | 0 refills | Status: AC

## 2020-11-13 NOTE — Telephone Encounter (Signed)
S/w pt, reporting vaginal itching, requesting rx for yeast, sent with approval.

## 2021-02-08 ENCOUNTER — Ambulatory Visit (INDEPENDENT_AMBULATORY_CARE_PROVIDER_SITE_OTHER): Payer: Medicaid Other

## 2021-02-08 ENCOUNTER — Other Ambulatory Visit (HOSPITAL_COMMUNITY)
Admission: RE | Admit: 2021-02-08 | Discharge: 2021-02-08 | Disposition: A | Discharge status: Home / Self Care | Payer: Medicaid Other | Source: Ambulatory Visit | Attending: Obstetrics | Admitting: Obstetrics

## 2021-02-08 ENCOUNTER — Other Ambulatory Visit: Payer: Self-pay

## 2021-02-08 VITALS — BP 129/79 | HR 80 | Wt 169.0 lb

## 2021-02-08 DIAGNOSIS — Z113 Encounter for screening for infections with a predominantly sexual mode of transmission: Secondary | ICD-10-CM | POA: Diagnosis not present

## 2021-02-08 DIAGNOSIS — Z3202 Encounter for pregnancy test, result negative: Secondary | ICD-10-CM

## 2021-02-08 LAB — POCT URINE PREGNANCY: Preg Test, Ur: NEGATIVE

## 2021-02-08 NOTE — Progress Notes (Addendum)
SUBJECTIVE:  20 y.o. female complains of feeling pregnancy and also wants to be screened for STD. Denies abnormal vaginal bleeding or significant pelvic pain or fever. No UTI symptoms. Denies history of known exposure to STD.  Patient's last menstrual period was 01/15/2021 (exact date).  OBJECTIVE:  She appears well, afebrile. Urine dipstick: not done.  ASSESSMENT:  STD Screening Possible pregnancy.  UPT today is NEGATIVE.   PLAN:  GC, chlamydia, trichomonas, BVAG, CVAG probe sent to lab. Treatment: To be determined once lab results are received ROV prn if symptoms persist or worsen.  Patient was assessed and managed by nursing staff during this encounter. I have reviewed the chart and agree with the documentation and plan. I have also made any necessary editorial changes.  Coral Ceo, MD 02/08/2021 10:42 AM

## 2021-02-09 ENCOUNTER — Other Ambulatory Visit: Payer: Self-pay | Admitting: Obstetrics

## 2021-02-09 DIAGNOSIS — A749 Chlamydial infection, unspecified: Secondary | ICD-10-CM

## 2021-02-09 DIAGNOSIS — B9689 Other specified bacterial agents as the cause of diseases classified elsewhere: Secondary | ICD-10-CM

## 2021-02-09 DIAGNOSIS — N760 Acute vaginitis: Secondary | ICD-10-CM

## 2021-02-09 LAB — CERVICOVAGINAL ANCILLARY ONLY
Bacterial Vaginitis (gardnerella): POSITIVE — AB
Candida Glabrata: NEGATIVE
Candida Vaginitis: NEGATIVE
Chlamydia: POSITIVE — AB
Comment: NEGATIVE
Comment: NEGATIVE
Comment: NEGATIVE
Comment: NEGATIVE
Comment: NEGATIVE
Comment: NORMAL
Neisseria Gonorrhea: NEGATIVE
Trichomonas: NEGATIVE

## 2021-02-09 MED ORDER — METRONIDAZOLE 500 MG PO TABS: 500.0000 mg | ORAL_TABLET | Freq: Two times a day (BID) | ORAL | 2 refills | Status: DC

## 2021-02-09 MED ORDER — DOXYCYCLINE HYCLATE 100 MG PO CAPS: 100.0000 mg | ORAL_CAPSULE | Freq: Two times a day (BID) | ORAL | 0 refills | Status: DC

## 2021-03-08 ENCOUNTER — Telehealth: Payer: Self-pay

## 2021-03-08 ENCOUNTER — Other Ambulatory Visit: Payer: Self-pay

## 2021-03-08 MED ORDER — METRONIDAZOLE 1 % EX GEL: Freq: Every day | CUTANEOUS | 0 refills | Status: DC

## 2021-03-08 MED ORDER — FLUCONAZOLE 150 MG PO TABS: 150.0000 mg | ORAL_TABLET | Freq: Once | ORAL | 0 refills | Status: AC

## 2021-03-08 NOTE — Telephone Encounter (Signed)
Alicia Black is requesting medications for BV and yeast however she tested positive for chlamydia and BV on 02/08/2021. She is do for a test of cure. I called to advise patient of this information. No answer or voice mail to leave a message.

## 2021-03-13 ENCOUNTER — Other Ambulatory Visit: Payer: Self-pay

## 2021-03-13 ENCOUNTER — Other Ambulatory Visit (HOSPITAL_COMMUNITY)
Admission: RE | Admit: 2021-03-13 | Discharge: 2021-03-13 | Disposition: A | Discharge status: Home / Self Care | Payer: Medicaid Other | Source: Ambulatory Visit | Attending: Obstetrics | Admitting: Obstetrics

## 2021-03-13 ENCOUNTER — Ambulatory Visit (INDEPENDENT_AMBULATORY_CARE_PROVIDER_SITE_OTHER): Payer: Medicaid Other

## 2021-03-13 DIAGNOSIS — Z8619 Personal history of other infectious and parasitic diseases: Secondary | ICD-10-CM | POA: Diagnosis not present

## 2021-03-13 NOTE — Progress Notes (Signed)
Patient presents for a test of cure. Patient would also like to be rechecked for BV. She states that she did not finish her metronidazole, and only took 4 days worth.  Patient denies having any vaginal discharge, odor, or irritation. Patient states that she had protected intercourse after treatment. Self Swab Completed

## 2021-03-13 NOTE — Progress Notes (Signed)
Patient was assessed and managed by nursing staff during this encounter. I have reviewed the chart and agree with the documentation and plan. I have also made any necessary editorial changes.  Rafay Dahan, MD 03/13/2021 3:41 PM    

## 2021-03-14 LAB — CERVICOVAGINAL ANCILLARY ONLY
Bacterial Vaginitis (gardnerella): POSITIVE — AB
Candida Glabrata: NEGATIVE
Candida Vaginitis: NEGATIVE
Chlamydia: NEGATIVE
Comment: NEGATIVE
Comment: NEGATIVE
Comment: NEGATIVE
Comment: NEGATIVE
Comment: NEGATIVE
Comment: NORMAL
Neisseria Gonorrhea: NEGATIVE
Trichomonas: NEGATIVE

## 2021-03-15 ENCOUNTER — Other Ambulatory Visit: Payer: Self-pay | Admitting: Obstetrics and Gynecology

## 2021-03-15 DIAGNOSIS — B9689 Other specified bacterial agents as the cause of diseases classified elsewhere: Secondary | ICD-10-CM

## 2021-03-15 DIAGNOSIS — N760 Acute vaginitis: Secondary | ICD-10-CM

## 2021-03-15 MED ORDER — METRONIDAZOLE 0.75 % VA GEL: 1.0000 | Freq: Every day | VAGINAL | 1 refills | Status: DC

## 2021-03-15 MED ORDER — METRONIDAZOLE 500 MG PO TABS: 500.0000 mg | ORAL_TABLET | Freq: Two times a day (BID) | ORAL | 0 refills | Status: DC

## 2021-04-06 ENCOUNTER — Telehealth: Payer: Self-pay

## 2021-04-06 MED ORDER — FLUCONAZOLE 150 MG PO TABS: 150.0000 mg | ORAL_TABLET | Freq: Once | ORAL | 3 refills | Status: AC

## 2021-04-06 NOTE — Telephone Encounter (Signed)
Pt c/o vaginal d/c with itching. Request rx for diflucan. Rx sent to [facility] [place] diflucan 150mg  1 po no refills.

## 2021-05-05 ENCOUNTER — Other Ambulatory Visit: Payer: Self-pay | Admitting: Obstetrics and Gynecology

## 2021-05-05 DIAGNOSIS — B9689 Other specified bacterial agents as the cause of diseases classified elsewhere: Secondary | ICD-10-CM

## 2021-05-05 DIAGNOSIS — B379 Candidiasis, unspecified: Secondary | ICD-10-CM

## 2021-05-14 ENCOUNTER — Other Ambulatory Visit: Payer: Self-pay

## 2021-05-14 DIAGNOSIS — N760 Acute vaginitis: Secondary | ICD-10-CM

## 2021-05-14 MED ORDER — METRONIDAZOLE 0.75 % VA GEL: 1.0000 | Freq: Every day | VAGINAL | 1 refills | Status: DC

## 2021-05-14 MED ORDER — FLUCONAZOLE 150 MG PO TABS: 150.0000 mg | ORAL_TABLET | Freq: Once | ORAL | 0 refills | Status: AC

## 2021-05-14 NOTE — Telephone Encounter (Signed)
Patient think she has yeast and BV. She is requesting Metrogel and diflucan to clear up her symptoms. Advised patient we will call in one dose of each. She will need to be seen for any other refill request. Patient advised understanding.

## 2021-05-24 MED ORDER — FLUCONAZOLE 150 MG PO TABS: 150.0000 mg | ORAL_TABLET | Freq: Once | ORAL | 3 refills | Status: AC

## 2021-05-24 NOTE — Telephone Encounter (Signed)
Patient called requesting for metronidazole tablets to be sent to the pharmacy instead of the metrogel for BV. Patient also request for a diflucan for yeast,  Both RX sent to pharmacy per protocol.

## 2021-06-22 ENCOUNTER — Other Ambulatory Visit: Payer: Self-pay

## 2021-06-22 DIAGNOSIS — N898 Other specified noninflammatory disorders of vagina: Secondary | ICD-10-CM

## 2021-06-22 MED ORDER — FLUCONAZOLE 150 MG PO TABS: 150.0000 mg | ORAL_TABLET | Freq: Once | ORAL | 0 refills | Status: AC

## 2021-06-25 ENCOUNTER — Ambulatory Visit: Payer: Medicaid Other

## 2021-07-04 ENCOUNTER — Ambulatory Visit: Payer: Medicaid Other

## 2021-07-12 ENCOUNTER — Other Ambulatory Visit (HOSPITAL_COMMUNITY)
Admission: RE | Admit: 2021-07-12 | Discharge: 2021-07-12 | Disposition: A | Discharge status: Home / Self Care | Payer: Medicaid Other | Source: Ambulatory Visit

## 2021-07-12 ENCOUNTER — Ambulatory Visit (INDEPENDENT_AMBULATORY_CARE_PROVIDER_SITE_OTHER): Payer: Medicaid Other

## 2021-07-12 ENCOUNTER — Other Ambulatory Visit: Payer: Self-pay

## 2021-07-12 VITALS — BP 112/72 | HR 78

## 2021-07-12 DIAGNOSIS — B9689 Other specified bacterial agents as the cause of diseases classified elsewhere: Secondary | ICD-10-CM

## 2021-07-12 DIAGNOSIS — N760 Acute vaginitis: Secondary | ICD-10-CM

## 2021-07-12 DIAGNOSIS — N898 Other specified noninflammatory disorders of vagina: Secondary | ICD-10-CM

## 2021-07-12 NOTE — Progress Notes (Signed)
SUBJECTIVE:  20 y.o. female complains of white vaginal discharge for a couple of days. Denies abnormal vaginal bleeding or significant pelvic pain or fever. No UTI symptoms. Denies history of known exposure to STD.  No LMP recorded.  OBJECTIVE:  She appears well, afebrile. Urine dipstick: not done.  ASSESSMENT:  Vaginal Discharge: small amount  Vaginal Odor: small amount    PLAN:  GC, chlamydia, trichomonas, BVAG, CVAG probe sent to lab. Treatment: To be determined once lab results are received ROV prn if symptoms persist or worsen.

## 2021-07-13 LAB — CERVICOVAGINAL ANCILLARY ONLY
Bacterial Vaginitis (gardnerella): POSITIVE — AB
Candida Glabrata: NEGATIVE
Candida Vaginitis: NEGATIVE
Chlamydia: NEGATIVE
Comment: NEGATIVE
Comment: NEGATIVE
Comment: NEGATIVE
Comment: NEGATIVE
Comment: NEGATIVE
Comment: NORMAL
Neisseria Gonorrhea: POSITIVE — AB
Trichomonas: NEGATIVE

## 2021-07-14 ENCOUNTER — Other Ambulatory Visit: Payer: Self-pay | Admitting: Obstetrics

## 2021-07-14 DIAGNOSIS — N760 Acute vaginitis: Secondary | ICD-10-CM

## 2021-07-14 DIAGNOSIS — B9689 Other specified bacterial agents as the cause of diseases classified elsewhere: Secondary | ICD-10-CM

## 2021-07-14 DIAGNOSIS — A549 Gonococcal infection, unspecified: Secondary | ICD-10-CM

## 2021-07-14 MED ORDER — CEFTRIAXONE SODIUM 250 MG IJ SOLR: 250.0000 mg | Freq: Once | INTRAMUSCULAR | Status: DC

## 2021-07-14 MED ORDER — METRONIDAZOLE 500 MG PO TABS
500.0000 mg | ORAL_TABLET | Freq: Two times a day (BID) | ORAL | 2 refills | Status: DC
Start: 2021-07-14 — End: 2021-07-16

## 2021-07-16 ENCOUNTER — Other Ambulatory Visit: Payer: Self-pay

## 2021-07-16 ENCOUNTER — Ambulatory Visit (INDEPENDENT_AMBULATORY_CARE_PROVIDER_SITE_OTHER): Payer: Medicaid Other

## 2021-07-16 VITALS — BP 131/82 | HR 72

## 2021-07-16 DIAGNOSIS — A5402 Gonococcal vulvovaginitis, unspecified: Secondary | ICD-10-CM

## 2021-07-16 DIAGNOSIS — A549 Gonococcal infection, unspecified: Secondary | ICD-10-CM

## 2021-07-16 MED ORDER — CEFTRIAXONE SODIUM 500 MG IJ SOLR
500.0000 mg | Freq: Once | INTRAMUSCULAR | Status: AC
  Administered 2021-07-16: 500 mg via INTRAMUSCULAR

## 2021-07-16 NOTE — Progress Notes (Signed)
Patient presents to the office today for  gonorrhea treatment. Given by D.Cheree Ditto in [place]. Dose: Ceftriaxone 500 mg Side Effects: None to note Follow Up: 4 weeks toc Patient has been advised to make her partner or partners aware of infection.Patient verbalizes understanding.

## 2021-08-13 ENCOUNTER — Other Ambulatory Visit: Payer: Self-pay

## 2021-08-13 ENCOUNTER — Other Ambulatory Visit (HOSPITAL_COMMUNITY)
Admission: RE | Admit: 2021-08-13 | Discharge: 2021-08-13 | Disposition: A | Discharge status: Home / Self Care | Payer: Medicaid Other | Source: Ambulatory Visit | Attending: Obstetrics and Gynecology | Admitting: Obstetrics and Gynecology

## 2021-08-13 ENCOUNTER — Ambulatory Visit (INDEPENDENT_AMBULATORY_CARE_PROVIDER_SITE_OTHER): Payer: Medicaid Other

## 2021-08-13 VITALS — BP 140/82 | HR 72

## 2021-08-13 DIAGNOSIS — N898 Other specified noninflammatory disorders of vagina: Secondary | ICD-10-CM

## 2021-08-13 NOTE — Progress Notes (Signed)
SUBJECTIVE:  20 y.o. female complains of clear vaginal discharge for several days.  Denies abnormal vaginal bleeding or significant pelvic pain or fever. No UTI symptoms. Denies history of known exposure to STD.  No LMP recorded.  OBJECTIVE:  She appears well, afebrile. Urine dipstick: not done.  ASSESSMENT:  Vaginal Discharge: small amount  Vaginal Odor: small amount   PLAN:  GC, chlamydia, trichomonas, BVAG, CVAG probe sent to lab. Treatment: To be determined once lab results are received ROV prn if symptoms persist or worsen.

## 2021-08-14 LAB — CERVICOVAGINAL ANCILLARY ONLY
Bacterial Vaginitis (gardnerella): POSITIVE — AB
Candida Glabrata: NEGATIVE
Candida Vaginitis: NEGATIVE
Chlamydia: NEGATIVE
Comment: NEGATIVE
Comment: NEGATIVE
Comment: NEGATIVE
Comment: NEGATIVE
Comment: NEGATIVE
Comment: NORMAL
Neisseria Gonorrhea: NEGATIVE
Trichomonas: NEGATIVE

## 2021-08-15 ENCOUNTER — Encounter: Payer: Self-pay | Admitting: *Deleted

## 2021-08-15 ENCOUNTER — Other Ambulatory Visit: Payer: Self-pay | Admitting: Obstetrics & Gynecology

## 2021-08-15 DIAGNOSIS — B9689 Other specified bacterial agents as the cause of diseases classified elsewhere: Secondary | ICD-10-CM

## 2021-08-15 DIAGNOSIS — N760 Acute vaginitis: Secondary | ICD-10-CM

## 2021-08-15 MED ORDER — METRONIDAZOLE 0.75 % VA GEL: 1.0000 | Freq: Every day | VAGINAL | 5 refills | Status: DC

## 2021-08-15 NOTE — Progress Notes (Signed)
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  Needs appointment with provider to discuss why this continues to happen. Recommend prolonged course of Metronidazole therapy for eradication, this was prescribed. Please emphasize proper vulvovaginal hygiene practices.  Please call to inform patient of results and recommendations.  Jaynie Collins, MD

## 2021-08-15 NOTE — Progress Notes (Signed)
Patient is active in MyChart. Message sent regarding recurrent BV and vaginal infections and recommendations to make an appointment for further discussion to determine cause. Patient informed of RX sent to pharmacy. Patient education on BV included in message.

## 2021-11-15 ENCOUNTER — Telehealth: Payer: Self-pay

## 2021-11-15 DIAGNOSIS — N760 Acute vaginitis: Secondary | ICD-10-CM

## 2021-11-15 MED ORDER — METRONIDAZOLE 500 MG PO TABS: 500.0000 mg | ORAL_TABLET | Freq: Two times a day (BID) | ORAL | 0 refills | Status: DC

## 2021-11-15 MED ORDER — FLUCONAZOLE 150 MG PO TABS: 150.0000 mg | ORAL_TABLET | Freq: Once | ORAL | 0 refills | Status: AC

## 2021-11-15 NOTE — Telephone Encounter (Signed)
Pt called c/o vaginal d/c with itching and odor. States she has the metrogel but would prefer to take the pill. Also request a prescription for diflucan to take after the Flagyl. Will send in both medications for patient.

## 2021-12-27 ENCOUNTER — Ambulatory Visit (INDEPENDENT_AMBULATORY_CARE_PROVIDER_SITE_OTHER): Payer: Medicaid Other

## 2021-12-27 ENCOUNTER — Other Ambulatory Visit (HOSPITAL_COMMUNITY)
Admission: RE | Admit: 2021-12-27 | Discharge: 2021-12-27 | Disposition: A | Discharge status: Home / Self Care | Payer: Medicaid Other | Source: Ambulatory Visit | Attending: Obstetrics & Gynecology | Admitting: Obstetrics & Gynecology

## 2021-12-27 ENCOUNTER — Other Ambulatory Visit: Payer: Self-pay

## 2021-12-27 DIAGNOSIS — N898 Other specified noninflammatory disorders of vagina: Secondary | ICD-10-CM | POA: Insufficient documentation

## 2021-12-27 NOTE — Progress Notes (Signed)
SUBJECTIVE:  21 y.o. female complains of white and creamy vaginal discharge with no odor, irritation, or itching for 2 week(s). Denies abnormal vaginal bleeding or significant pelvic pain or fever. No UTI symptoms. Denies history of known exposure to STD.  LMP 12/14/20  OBJECTIVE:  She appears well, afebrile. Urine dipstick: not done.  ASSESSMENT:  Vaginal Discharge  Vaginal Odor   PLAN:  GC, chlamydia, trichomonas, BVAG, CVAG probe sent to lab. Treatment: To be determined once lab results are received ROV prn if symptoms persist or worsen.

## 2021-12-28 LAB — CERVICOVAGINAL ANCILLARY ONLY
Bacterial Vaginitis (gardnerella): POSITIVE — AB
Candida Glabrata: NEGATIVE
Candida Vaginitis: NEGATIVE
Chlamydia: NEGATIVE
Comment: NEGATIVE
Comment: NEGATIVE
Comment: NEGATIVE
Comment: NEGATIVE
Comment: NEGATIVE
Comment: NORMAL
Neisseria Gonorrhea: NEGATIVE
Trichomonas: NEGATIVE

## 2022-01-02 ENCOUNTER — Other Ambulatory Visit: Payer: Self-pay

## 2022-01-02 DIAGNOSIS — B9689 Other specified bacterial agents as the cause of diseases classified elsewhere: Secondary | ICD-10-CM

## 2022-01-02 MED ORDER — METRONIDAZOLE 500 MG PO TABS: 500.0000 mg | ORAL_TABLET | Freq: Two times a day (BID) | ORAL | 0 refills | Status: DC

## 2022-02-04 ENCOUNTER — Telehealth: Payer: Self-pay

## 2022-02-04 MED ORDER — FLUCONAZOLE 150 MG PO TABS: 150.0000 mg | ORAL_TABLET | Freq: Once | ORAL | 0 refills | Status: AC

## 2022-02-04 NOTE — Telephone Encounter (Signed)
Returned call, pt requested rx for yeast after finishing flagyl, rx sent. Advised pt to schedule annual ?

## 2022-02-26 ENCOUNTER — Telehealth: Payer: Self-pay | Admitting: *Deleted

## 2022-02-26 NOTE — Telephone Encounter (Signed)
TC from patient requesting Diflucan and Metronidazole. Multiple instances of RX sent for similar complaints. Patient had not been seen by MD or APP since 10/2020. Advised no RX would be sent at this time. Advised need for annual with Pap smear now that she is 20. Offered to transfer call to schedulers, pt declined. Per previous note by Dr. Macon Large pt needs to come in to determine root cause of recurrent vaginal infections. DO NOT SEND FURTHER RXS UNTIL ANNUAL DONE. ?

## 2022-02-27 ENCOUNTER — Emergency Department (HOSPITAL_COMMUNITY)
Admission: EM | Admit: 2022-02-27 | Discharge: 2022-02-27 | Disposition: A | Discharge status: Home / Self Care | Payer: Medicaid Other | Attending: Emergency Medicine | Admitting: Emergency Medicine

## 2022-02-27 ENCOUNTER — Encounter (HOSPITAL_COMMUNITY): Payer: Self-pay | Admitting: Emergency Medicine

## 2022-02-27 ENCOUNTER — Other Ambulatory Visit: Payer: Self-pay

## 2022-02-27 DIAGNOSIS — L292 Pruritus vulvae: Secondary | ICD-10-CM | POA: Insufficient documentation

## 2022-02-27 DIAGNOSIS — R300 Dysuria: Secondary | ICD-10-CM | POA: Diagnosis not present

## 2022-02-27 DIAGNOSIS — N898 Other specified noninflammatory disorders of vagina: Secondary | ICD-10-CM | POA: Insufficient documentation

## 2022-02-27 LAB — URINALYSIS, ROUTINE W REFLEX MICROSCOPIC
Bilirubin Urine: NEGATIVE
Glucose, UA: NEGATIVE mg/dL
Hgb urine dipstick: NEGATIVE
Ketones, ur: NEGATIVE mg/dL
Nitrite: NEGATIVE
Protein, ur: NEGATIVE mg/dL
Specific Gravity, Urine: 1.012 (ref 1.005–1.030)
pH: 6 (ref 5.0–8.0)

## 2022-02-27 LAB — WET PREP, GENITAL
Clue Cells Wet Prep HPF POC: NONE SEEN
Sperm: NONE SEEN
Trich, Wet Prep: NONE SEEN
WBC, Wet Prep HPF POC: >=10 — AB (ref <10)
Yeast Wet Prep HPF POC: NONE SEEN

## 2022-02-27 MED ORDER — DOXYCYCLINE HYCLATE 100 MG PO TABS
100.0000 mg | ORAL_TABLET | Freq: Once | ORAL | Status: AC
  Administered 2022-02-27: 100 mg via ORAL
  Filled 2022-02-27: qty 1

## 2022-02-27 MED ORDER — CEFTRIAXONE SODIUM 500 MG IJ SOLR
500.0000 mg | Freq: Once | INTRAMUSCULAR | Status: AC
  Administered 2022-02-27: 500 mg via INTRAMUSCULAR
  Filled 2022-02-27: qty 500

## 2022-02-27 MED ORDER — LIDOCAINE HCL (PF) 1 % IJ SOLN
1.0000 mL | Freq: Once | INTRAMUSCULAR | Status: AC
  Administered 2022-02-27: 1 mL
  Filled 2022-02-27: qty 5

## 2022-02-27 NOTE — Discharge Instructions (Signed)
Today you were tested for STIs.  Will be positive in the next 24 to 48 hours.  You were prophylactically treated however if results are positive I will send antibiotics to your pharmacy for completion of treatment.   ? ?Please follow-up with your OB/GYN or the [facility] if symptoms continue despite treatment. ?

## 2022-02-27 NOTE — ED Triage Notes (Signed)
Pt. Stated, I think I have a yeast infection. I have itching and a clumpy discharge but no smell ?

## 2022-02-27 NOTE — ED Notes (Signed)
Pt verbalizes understanding of discharge instructions. Opportunity for questions and answers were provided. Pt discharged from the ED.   ?

## 2022-02-27 NOTE — ED Provider Notes (Signed)
?[facility] EMERGENCY DEPARTMENT ?Provider Note ? ? ?CSN: [id number] ?Arrival date & time: 02/27/22  0749 ? ?  ? ?History ? ?Chief Complaint  ?Patient presents with  ? Vaginal Discharge  ? Vaginal Itching  ? ? ?Alicia Black is a 21 y.o. female. ? ?Patient is a 21 year old female presenting for vaginal discharge.  Patient mitts to increased vaginal discharge over the last several days including dysuria and vaginal pruritis.  Patient denies any fevers, chills, nausea, vomiting, abdominal pain.  Denies vaginal bleeding.  Denies current pregnancy.  Admits to recent protected sexual intercourse.  Concerns for yeast infection. ? ?The history is provided by the patient. No language interpreter was used.  ?Vaginal Discharge ?Associated symptoms: dysuria and vaginal itching   ?Associated symptoms: no abdominal pain, no fever and no vomiting   ?Vaginal Itching ?Pertinent negatives include no chest pain, no abdominal pain and no shortness of breath.  ? ?  ? ?Home Medications ?Prior to Admission medications   ?Medication Sig Start Date End Date Taking? Authorizing Provider  ?metroNIDAZOLE (FLAGYL) 500 MG tablet Take 1 tablet (500 mg total) by mouth 2 (two) times daily. 01/02/22   Brock Bad, MD  ?   ? ?Allergies    ?Patient has no known allergies.   ? ?Review of Systems   ?Review of Systems  ?Constitutional:  Negative for chills and fever.  ?HENT:  Negative for ear pain and sore throat.   ?Eyes:  Negative for pain and visual disturbance.  ?Respiratory:  Negative for cough and shortness of breath.   ?Cardiovascular:  Negative for chest pain and palpitations.  ?Gastrointestinal:  Negative for abdominal pain and vomiting.  ?Genitourinary:  Positive for dysuria and vaginal discharge. Negative for hematuria.  ?Musculoskeletal:  Negative for arthralgias and back pain.  ?Skin:  Negative for color change and rash.  ?Neurological:  Negative for seizures and syncope.  ?All other systems reviewed and are  negative. ? ?Physical Exam ?Updated Vital Signs ?BP 130/74   Pulse 71   Temp 98 ?F (36.7 ?C) (Oral)   Resp 14   LMP 02/06/2022   SpO2 100%  ?Physical Exam ?Vitals and nursing note reviewed.  ?Constitutional:   ?   General: She is not in acute distress. ?   Appearance: She is well-developed.  ?HENT:  ?   Head: Normocephalic and atraumatic.  ?Eyes:  ?   Conjunctiva/sclera: Conjunctivae normal.  ?Cardiovascular:  ?   Rate and Rhythm: Normal rate and regular rhythm.  ?   Heart sounds: No murmur heard. ?Pulmonary:  ?   Effort: Pulmonary effort is normal. No respiratory distress.  ?   Breath sounds: Normal breath sounds.  ?Abdominal:  ?   Palpations: Abdomen is soft.  ?   Tenderness: There is no abdominal tenderness.  ?Genitourinary: ?   Vagina: Vaginal discharge present. No tenderness.  ?   Cervix: Normal.  ?   Adnexa: Right adnexa normal and left adnexa normal.  ?Musculoskeletal:     ?   General: No swelling.  ?   Cervical back: Neck supple.  ?Skin: ?   General: Skin is warm and dry.  ?   Capillary Refill: Capillary refill takes less than 2 seconds.  ?Neurological:  ?   Mental Status: She is alert.  ?Psychiatric:     ?   Mood and Affect: Mood normal.  ? ? ?ED Results / Procedures / Treatments   ?Labs ?(all labs ordered are listed, but only abnormal results are displayed) ?  Labs Reviewed  ?POC URINE PREG, ED  ? ? ?EKG ?None ? ?Radiology ?No results found. ? ?Procedures ?Procedures  ? ? ?Medications Ordered in ED ?Medications - No data to display ? ?ED Course/ Medical Decision Making/ A&P ?  ?                        ?Medical Decision Making ?Amount and/or Complexity of Data Reviewed ?Labs: ordered. ? ?Risk ?Prescription drug management. ? ? ?78:69 AM ?21 year old female presenting for vaginal discharge.  Patient is alert and oriented x3, no acute distress, vital signs.  Physical exam demonstrate thick discharge.  General tenderness.  No vaginal bleeding.  Normal cervix.  No adnexal tenderness.  Urine pregnancy  negative. Wet mount negative for yeast, trichomonas, and BV.  Patient requesting prophylactic treatment for STDs at this time.    Ceftriaxone azithromycin given.  She notified that results will take 24 to 48 hours for gonorrhea and chlamydia.  We will call her if any positive results. ? ?Patient in no distress and overall condition improved here in the ED. Detailed discussions were had with the patient regarding current findings, and need for close f/u with PCP or on call doctor. The patient has been instructed to return immediately if the symptoms worsen in any way for re-evaluation. Patient verbalized understanding and is in agreement with current care plan. All questions answered prior to discharge. ? ? ? ? ? ? ? ? ?Final Clinical Impression(s) / ED Diagnoses ?Final diagnoses:  ?Vaginal discharge  ? ? ?Rx / DC Orders ?ED Discharge Orders   ? ? None  ? ?  ? ? ?  ?Franne Forts, DO ?02/27/22 0867 ? ?

## 2022-02-28 ENCOUNTER — Telehealth: Payer: Self-pay

## 2022-02-28 ENCOUNTER — Other Ambulatory Visit: Payer: Self-pay

## 2022-02-28 ENCOUNTER — Telehealth (HOSPITAL_COMMUNITY): Payer: Self-pay

## 2022-02-28 DIAGNOSIS — B379 Candidiasis, unspecified: Secondary | ICD-10-CM

## 2022-02-28 LAB — GC/CHLAMYDIA PROBE AMP (~~LOC~~) NOT AT ARMC
Chlamydia: NEGATIVE
Comment: NEGATIVE
Comment: NORMAL
Neisseria Gonorrhea: NEGATIVE

## 2022-02-28 MED ORDER — FLUCONAZOLE 150 MG PO TABS: 150.0000 mg | ORAL_TABLET | Freq: Once | ORAL | 0 refills | Status: AC

## 2022-02-28 NOTE — Telephone Encounter (Signed)
Patient calls stating that she is having a cottage cheese like discharge and itching. Rx sent for yeast per protocol. ?

## 2022-02-28 NOTE — Telephone Encounter (Signed)
Transition Care Management Follow-up Telephone Call ?Date of discharge and from where: 02/27/2022 from [facility] ?How have you been since you were released from the hospital? Patient stated that she is feeling well and did not have any questions or concerns at this time.  ?Any questions or concerns? No ? ?Items Reviewed: ?Did the pt receive and understand the discharge instructions provided? Yes  ?Medications obtained and verified? Yes  ?Other? No  ?Any new allergies since your discharge? No  ?Dietary orders reviewed? No ?Do you have support at home? Yes  ? ?Functional Questionnaire: (I = Independent and D = Dependent) ?ADLs: I ? ?Bathing/Dressing- I ? ?Meal Prep- I ? ?Eating- I ? ?Maintaining continence- I ? ?Transferring/Ambulation- I ? ?Managing Meds- I ? ?Follow up appointments reviewed: ? ?PCP Hospital f/u appt confirmed? No  Patient did not want to est care at this time.  ?[facility] f/u appt confirmed? No   ?Are transportation arrangements needed? No  ?If their condition worsens, is the pt aware to call PCP or go to the Emergency Dept.? Yes ?Was the patient provided with contact information for the PCP's office or ED? Yes ?Was to pt encouraged to call back with questions or concerns? Yes ? ?

## 2022-05-21 ENCOUNTER — Other Ambulatory Visit: Payer: Self-pay | Admitting: Obstetrics

## 2022-05-21 DIAGNOSIS — N760 Acute vaginitis: Secondary | ICD-10-CM

## 2022-05-21 MED ORDER — METRONIDAZOLE 500 MG PO TABS: 500.0000 mg | ORAL_TABLET | Freq: Two times a day (BID) | ORAL | 0 refills | Status: DC

## 2022-05-22 ENCOUNTER — Ambulatory Visit (INDEPENDENT_AMBULATORY_CARE_PROVIDER_SITE_OTHER): Payer: Medicaid Other

## 2022-05-22 ENCOUNTER — Other Ambulatory Visit (HOSPITAL_COMMUNITY)
Admission: RE | Admit: 2022-05-22 | Discharge: 2022-05-22 | Disposition: A | Discharge status: Home / Self Care | Payer: Medicaid Other | Source: Ambulatory Visit | Attending: Obstetrics and Gynecology | Admitting: Obstetrics and Gynecology

## 2022-05-22 VITALS — BP 139/85 | HR 74 | Ht 67.0 in | Wt 153.0 lb

## 2022-05-22 DIAGNOSIS — N898 Other specified noninflammatory disorders of vagina: Secondary | ICD-10-CM

## 2022-05-22 NOTE — Progress Notes (Signed)
Agree with nurses's documentation of this patient's clinic encounter.  Imari Sivertsen L, MD  

## 2022-05-22 NOTE — Progress Notes (Signed)
SUBJECTIVE:  21 y.o. female complains of white and thick vaginal discharge for 6 day(s). Denies abnormal vaginal bleeding or significant pelvic pain or fever. No UTI symptoms. Denies history of known exposure to STD.  Patient's last menstrual period was 05/15/2022 (exact date).  OBJECTIVE:  She appears well, afebrile. Urine dipstick: not done.  ASSESSMENT:  Vaginal Discharge  Vaginal Odor   PLAN:  GC, chlamydia, trichomonas, BVAG, CVAG probe sent to lab. Treatment: To be determined once lab results are received ROV prn if symptoms persist or worsen.

## 2022-05-23 LAB — CERVICOVAGINAL ANCILLARY ONLY
Bacterial Vaginitis (gardnerella): POSITIVE — AB
Candida Glabrata: NEGATIVE
Candida Vaginitis: NEGATIVE
Chlamydia: NEGATIVE
Comment: NEGATIVE
Comment: NEGATIVE
Comment: NEGATIVE
Comment: NEGATIVE
Comment: NEGATIVE
Comment: NORMAL
Neisseria Gonorrhea: NEGATIVE
Trichomonas: NEGATIVE

## 2022-05-24 ENCOUNTER — Encounter: Payer: Self-pay | Admitting: Emergency Medicine

## 2022-05-24 ENCOUNTER — Other Ambulatory Visit: Payer: Self-pay | Admitting: Emergency Medicine

## 2022-05-24 DIAGNOSIS — B9689 Other specified bacterial agents as the cause of diseases classified elsewhere: Secondary | ICD-10-CM

## 2022-05-24 MED ORDER — METRONIDAZOLE 500 MG PO TABS: 500.0000 mg | ORAL_TABLET | Freq: Two times a day (BID) | ORAL | 0 refills | Status: DC

## 2022-05-24 NOTE — Progress Notes (Signed)
Rx for BV sent to pharmacy on 6/21.

## 2022-06-06 ENCOUNTER — Other Ambulatory Visit: Payer: Self-pay | Admitting: *Deleted

## 2022-06-06 MED ORDER — FLUCONAZOLE 150 MG PO TABS: 150.0000 mg | ORAL_TABLET | Freq: Once | ORAL | 0 refills | Status: AC

## 2022-06-06 NOTE — Progress Notes (Signed)
Pt request tx for yeast after recent antibiotic use. Diflucan sent today.

## 2022-11-22 ENCOUNTER — Ambulatory Visit (INDEPENDENT_AMBULATORY_CARE_PROVIDER_SITE_OTHER): Payer: Medicaid Other | Admitting: *Deleted

## 2022-11-22 ENCOUNTER — Other Ambulatory Visit (HOSPITAL_COMMUNITY)
Admission: RE | Admit: 2022-11-22 | Discharge: 2022-11-22 | Disposition: A | Discharge status: Home / Self Care | Payer: Medicaid Other | Source: Ambulatory Visit | Attending: Obstetrics and Gynecology | Admitting: Obstetrics and Gynecology

## 2022-11-22 VITALS — BP 132/86 | HR 91

## 2022-11-22 DIAGNOSIS — Z113 Encounter for screening for infections with a predominantly sexual mode of transmission: Secondary | ICD-10-CM | POA: Diagnosis present

## 2022-11-22 NOTE — Progress Notes (Signed)
SUBJECTIVE:  21 y.o. female who desires a STI screen. Denies abnormal vaginal discharge, bleeding or significant pelvic pain. No UTI symptoms. Denies history of known exposure to STD.  No LMP recorded.  OBJECTIVE:  She appears well.   ASSESSMENT:  STI Screen   PLAN:  Pt offered STI blood screening-declined GC, chlamydia, and trichomonas probe sent to lab.  Treatment: To be determined once lab results are received.  Pt follow up as needed.

## 2022-11-26 LAB — CERVICOVAGINAL ANCILLARY ONLY
Bacterial Vaginitis (gardnerella): POSITIVE — AB
Candida Glabrata: NEGATIVE
Candida Vaginitis: NEGATIVE
Chlamydia: NEGATIVE
Comment: NEGATIVE
Comment: NEGATIVE
Comment: NEGATIVE
Comment: NEGATIVE
Comment: NEGATIVE
Comment: NORMAL
Neisseria Gonorrhea: NEGATIVE
Trichomonas: NEGATIVE

## 2022-11-29 ENCOUNTER — Telehealth: Payer: Self-pay | Admitting: Emergency Medicine

## 2022-11-29 ENCOUNTER — Other Ambulatory Visit: Payer: Self-pay | Admitting: Emergency Medicine

## 2022-11-29 DIAGNOSIS — B9689 Other specified bacterial agents as the cause of diseases classified elsewhere: Secondary | ICD-10-CM

## 2022-11-29 MED ORDER — METRONIDAZOLE 500 MG PO TABS: 500.0000 mg | ORAL_TABLET | Freq: Two times a day (BID) | ORAL | 0 refills | Status: DC

## 2022-11-29 NOTE — Telephone Encounter (Signed)
-----   Message from Lennart Pall, MD sent at 11/27/2022  6:31 PM EST ----- +gardnerella. If not symptomatic, treatment is not needed. Can we discuss with patient? If symptomatic can treat with PO flagyl 500mg  BID x 7d or metrogel qHS x 5 days (whichever she prefers).   Thanks - KF

## 2022-11-29 NOTE — Telephone Encounter (Signed)
Pt informed of results. Rx sent to pharmacy.

## 2022-11-29 NOTE — Progress Notes (Signed)
Rx for BV 

## 2022-12-13 DIAGNOSIS — Z23 Encounter for immunization: Secondary | ICD-10-CM | POA: Diagnosis not present

## 2023-01-29 ENCOUNTER — Other Ambulatory Visit (HOSPITAL_COMMUNITY)
Admission: RE | Admit: 2023-01-29 | Discharge: 2023-01-29 | Disposition: A | Discharge status: Home / Self Care | Payer: Medicaid Other | Source: Ambulatory Visit | Attending: Obstetrics | Admitting: Obstetrics

## 2023-01-29 ENCOUNTER — Other Ambulatory Visit: Payer: Self-pay | Admitting: Obstetrics and Gynecology

## 2023-01-29 DIAGNOSIS — B9689 Other specified bacterial agents as the cause of diseases classified elsewhere: Secondary | ICD-10-CM

## 2023-01-29 DIAGNOSIS — N898 Other specified noninflammatory disorders of vagina: Secondary | ICD-10-CM | POA: Diagnosis not present

## 2023-01-29 DIAGNOSIS — Z01419 Encounter for gynecological examination (general) (routine) without abnormal findings: Secondary | ICD-10-CM | POA: Insufficient documentation

## 2023-01-30 ENCOUNTER — Encounter: Payer: Self-pay | Admitting: Obstetrics

## 2023-01-30 ENCOUNTER — Ambulatory Visit (INDEPENDENT_AMBULATORY_CARE_PROVIDER_SITE_OTHER): Payer: Medicaid Other | Admitting: Obstetrics

## 2023-01-30 VITALS — BP 126/76 | HR 84 | Wt 160.0 lb

## 2023-01-30 DIAGNOSIS — B3731 Acute candidiasis of vulva and vagina: Secondary | ICD-10-CM | POA: Diagnosis not present

## 2023-01-30 DIAGNOSIS — Z01419 Encounter for gynecological examination (general) (routine) without abnormal findings: Secondary | ICD-10-CM | POA: Diagnosis not present

## 2023-01-30 DIAGNOSIS — N898 Other specified noninflammatory disorders of vagina: Secondary | ICD-10-CM

## 2023-01-30 MED ORDER — FLUCONAZOLE 200 MG PO TABS: 200.0000 mg | ORAL_TABLET | ORAL | 2 refills | Status: DC

## 2023-01-30 NOTE — Progress Notes (Signed)
Patient ID: Alicia Black, female   DOB: 2001/10/27, 22 y.o.   MRN: [id number]  HPI Alicia Black is a 22 y.o. female.  Complains of white, thick, clumpy vaginal discharge and itching. HPI  Past Medical History:  Diagnosis Date   Sickle cell trait ([place])     Past Surgical History:  Procedure Laterality Date   NO PAST SURGERIES      Family History  Problem Relation Age of Onset   Asthma Father    Asthma Sister     Social History Social History   Tobacco Use   Smoking status: Former    Types: Cigars   Smokeless tobacco: Never  Vaping Use   Vaping Use: Former  Substance Use Topics   Alcohol use: Not Currently   Drug use: Not Currently    Types: Marijuana    No Known Allergies  Current Outpatient Medications  Medication Sig Dispense Refill   fluconazole (DIFLUCAN) 200 MG tablet Take 1 tablet (200 mg total) by mouth every 3 (three) days. 3 tablet 2   metroNIDAZOLE (FLAGYL) 500 MG tablet Take 1 tablet (500 mg total) by mouth 2 (two) times daily. (Patient not taking: Reported on 01/30/2023) 14 tablet 0   No current facility-administered medications for this visit.    Review of Systems Review of Systems Constitutional: negative for fatigue and weight loss Respiratory: negative for cough and wheezing Cardiovascular: negative for chest pain, fatigue and palpitations Gastrointestinal: negative for abdominal pain and change in bowel habits Genitourinary: positive for vaginal discharge with itching Integument/breast: negative for nipple discharge Musculoskeletal:negative for myalgias Neurological: negative for gait problems and tremors Behavioral/Psych: negative for abusive relationship, depression Endocrine: negative for temperature intolerance      Blood pressure 126/76, pulse 84, weight 160 lb (72.6 kg).  Physical Exam Physical Exam General:   Alert and no distress   Skin:   no rash or abnormalities  Lungs:   clear to auscultation bilaterally  Heart:   regular  rate and rhythm, S1, S2 normal, no murmur, click, rub or gallop  Breasts:   normal without suspicious masses, skin or nipple changes or axillary nodes  Abdomen:  normal findings: no organomegaly, soft, non-tender and no hernia  Pelvis:  External genitalia: normal general appearance Urinary system: urethral meatus normal and bladder without fullness, nontender Vaginal: thick, white, clumpy discharge without tenderness, induration or masses Cervix: normal appearance Adnexa: normal bimanual exam Uterus: anteverted and non-tender, normal size    I have spent a total of 20 minutes of face-to-face and non-face-to-face time, excluding clinical staff time, reviewing notes and preparing to see patient, ordering tests and/or medications, and counseling the patient.   Data Reviewed Wet Prep  Assessment     1. Encounter for gynecological examination with Papanicolaou smear of cervix Rx: - Cytology - PAP( [facility])  2. Vaginal discharge Rx: - Cervicovaginal ancillary only( [facility])  3. Candida vaginitis Rx: - fluconazole (DIFLUCAN) 200 MG tablet; Take 1 tablet (200 mg total) by mouth every 3 (three) days.  Dispense: 3 tablet; Refill: 2     Plan   Follow up prn  Meds ordered this encounter  Medications   fluconazole (DIFLUCAN) 200 MG tablet    Sig: Take 1 tablet (200 mg total) by mouth every 3 (three) days.    Dispense:  3 tablet    Refill:  2     Shelly Bombard, MD 01/30/2023 3:25 PM

## 2023-01-30 NOTE — Progress Notes (Signed)
Pt presents with thick white discharge, chunky x2 days.  Needs Pap

## 2023-01-31 ENCOUNTER — Other Ambulatory Visit: Payer: Self-pay | Admitting: Obstetrics

## 2023-01-31 LAB — CERVICOVAGINAL ANCILLARY ONLY
Bacterial Vaginitis (gardnerella): NEGATIVE
Candida Glabrata: NEGATIVE
Candida Vaginitis: POSITIVE — AB
Chlamydia: NEGATIVE
Comment: NEGATIVE
Comment: NEGATIVE
Comment: NEGATIVE
Comment: NEGATIVE
Comment: NEGATIVE
Comment: NORMAL
Neisseria Gonorrhea: NEGATIVE
Trichomonas: NEGATIVE

## 2023-02-15 ENCOUNTER — Other Ambulatory Visit: Payer: Self-pay | Admitting: Obstetrics

## 2023-02-15 DIAGNOSIS — B379 Candidiasis, unspecified: Secondary | ICD-10-CM

## 2023-02-15 MED ORDER — TERCONAZOLE 0.8 % VA CREA: 1.0000 | TOPICAL_CREAM | Freq: Every day | VAGINAL | 0 refills | Status: DC

## 2023-05-06 ENCOUNTER — Other Ambulatory Visit (HOSPITAL_COMMUNITY)
Admission: RE | Admit: 2023-05-06 | Discharge: 2023-05-06 | Disposition: A | Discharge status: Home / Self Care | Payer: Medicaid Other | Source: Ambulatory Visit | Attending: Obstetrics and Gynecology | Admitting: Obstetrics and Gynecology

## 2023-05-06 ENCOUNTER — Ambulatory Visit: Payer: Medicaid Other

## 2023-05-06 ENCOUNTER — Encounter: Payer: Self-pay | Admitting: Obstetrics and Gynecology

## 2023-05-06 VITALS — BP 130/63 | HR 62 | Wt 157.8 lb

## 2023-05-06 DIAGNOSIS — N898 Other specified noninflammatory disorders of vagina: Secondary | ICD-10-CM | POA: Insufficient documentation

## 2023-05-06 DIAGNOSIS — Z113 Encounter for screening for infections with a predominantly sexual mode of transmission: Secondary | ICD-10-CM | POA: Insufficient documentation

## 2023-05-06 NOTE — Progress Notes (Signed)
SUBJECTIVE:  22 y.o. female who reports vaginal discharge, desires vaginal STD screening. Denies bleeding or significant pelvic pain. No UTI symptoms. Denies history of known exposure to STD.  Patient's last menstrual period was 04/05/2023.  OBJECTIVE:  She appears well.   ASSESSMENT:  STI Screen  Vaginal Discharge  PLAN:  Pt offered STI blood screening-declined. GC, chlamydia, BV, yeast, and trichomonas probe sent to lab.  Treatment: To be determined once lab results are received.

## 2023-05-07 LAB — CERVICOVAGINAL ANCILLARY ONLY
Bacterial Vaginitis (gardnerella): POSITIVE — AB
Candida Glabrata: NEGATIVE
Candida Vaginitis: NEGATIVE
Chlamydia: NEGATIVE
Comment: NEGATIVE
Comment: NEGATIVE
Comment: NEGATIVE
Comment: NEGATIVE
Comment: NEGATIVE
Comment: NORMAL
Neisseria Gonorrhea: NEGATIVE
Trichomonas: NEGATIVE

## 2023-05-08 ENCOUNTER — Other Ambulatory Visit: Payer: Self-pay | Admitting: *Deleted

## 2023-05-08 DIAGNOSIS — N760 Acute vaginitis: Secondary | ICD-10-CM

## 2023-05-08 MED ORDER — METRONIDAZOLE 500 MG PO TABS: 500.0000 mg | ORAL_TABLET | Freq: Two times a day (BID) | ORAL | 0 refills | Status: DC

## 2023-05-08 NOTE — Progress Notes (Signed)
RX sent. [facility] message including pt education sent.

## 2023-05-26 ENCOUNTER — Other Ambulatory Visit: Payer: Self-pay | Admitting: Obstetrics and Gynecology

## 2023-05-26 ENCOUNTER — Other Ambulatory Visit: Payer: Self-pay

## 2023-05-26 DIAGNOSIS — B9689 Other specified bacterial agents as the cause of diseases classified elsewhere: Secondary | ICD-10-CM

## 2023-05-26 MED ORDER — METRONIDAZOLE 500 MG PO TABS: 500.0000 mg | ORAL_TABLET | Freq: Two times a day (BID) | ORAL | 0 refills | Status: DC

## 2023-06-06 ENCOUNTER — Other Ambulatory Visit: Payer: Self-pay | Admitting: Obstetrics

## 2023-06-06 DIAGNOSIS — N760 Acute vaginitis: Secondary | ICD-10-CM

## 2023-06-07 ENCOUNTER — Other Ambulatory Visit: Payer: Self-pay | Admitting: Obstetrics

## 2023-06-07 DIAGNOSIS — B3731 Acute candidiasis of vulva and vagina: Secondary | ICD-10-CM

## 2023-06-10 MED ORDER — METRONIDAZOLE 500 MG PO TABS: 500.0000 mg | ORAL_TABLET | Freq: Two times a day (BID) | ORAL | 0 refills | Status: DC

## 2023-07-04 ENCOUNTER — Ambulatory Visit (HOSPITAL_COMMUNITY)
Admission: RE | Admit: 2023-07-04 | Discharge: 2023-07-04 | Disposition: A | Discharge status: Home / Self Care | Payer: Medicaid Other | Source: Ambulatory Visit | Attending: Family Medicine | Admitting: Family Medicine

## 2023-07-04 ENCOUNTER — Encounter (HOSPITAL_COMMUNITY): Payer: Self-pay

## 2023-07-04 VITALS — BP 137/80 | HR 65 | Temp 97.9°F | Resp 16

## 2023-07-04 DIAGNOSIS — Z202 Contact with and (suspected) exposure to infections with a predominantly sexual mode of transmission: Secondary | ICD-10-CM | POA: Diagnosis not present

## 2023-07-04 NOTE — Discharge Instructions (Signed)
Staff will notify you if there is anything positive on the swab or blood tests

## 2023-07-04 NOTE — ED Notes (Signed)
Patient left AMA and stated that she was leaving and would come back next week to have her blood drawn.  Dr. Marlinda Mike notified.

## 2023-07-04 NOTE — ED Provider Notes (Addendum)
[facility]    CSN: [id number] Arrival date & time: 07/04/23  1534      History   Chief Complaint No chief complaint on file.   HPI Alicia Black is a 22 y.o. female.   HPI Here for concern for exposure to STD.  She recently had unprotected sex and would like testing.  She has not had any fever or abdominal pain or vaginal discharge or itching.  Last menstrual cycle was July 14  She is not allergic to any medicine   Past Medical History:  Diagnosis Date   Sickle cell trait [facility])     Patient Active Problem List   Diagnosis Date Noted   Preterm premature rupture of membranes (PPROM) delivered, current hospitalization 07/06/2020   Sickle cell trait ([facility]) 02/04/2020   Postpartum care following vaginal delivery 01/18/2020    Past Surgical History:  Procedure Laterality Date   NO PAST SURGERIES      OB History     Gravida  2   Para  1   Term      Preterm  1   AB  1   Living  1      SAB  1   IAB      Ectopic      Multiple  0   Live Births  1            Home Medications    Prior to Admission medications   Not on File    Family History Family History  Problem Relation Age of Onset   Asthma Father    Asthma Sister     Social History Social History   Tobacco Use   Smoking status: Former    Types: Cigars   Smokeless tobacco: Never  Vaping Use   Vaping status: Former  Substance Use Topics   Alcohol use: Not Currently   Drug use: Not Currently    Types: Marijuana     Allergies   Patient has no known allergies.   Review of Systems Review of Systems   Physical Exam Triage Vital Signs ED Triage Vitals  Encounter Vitals Group     BP 07/04/23 1542 137/80     Systolic BP Percentile --      Diastolic BP Percentile --      Pulse Rate 07/04/23 1542 65     Resp 07/04/23 1542 16     Temp 07/04/23 1542 97.9 F (36.6 C)     Temp Source 07/04/23 1542 Oral     SpO2 07/04/23 1542 98 %     Weight --      Height --       Head Circumference --      Peak Flow --      Pain Score 07/04/23 1545 0     Pain Loc --      Pain Education --      Exclude from Growth Chart --    No data found.  Updated Vital Signs BP 137/80 (BP Location: Right Arm)   Pulse 65   Temp 97.9 F (36.6 C) (Oral)   Resp 16   LMP 06/15/2023 (Exact Date)   SpO2 98%   Visual Acuity Right Eye Distance:   Left Eye Distance:   Bilateral Distance:    Right Eye Near:   Left Eye Near:    Bilateral Near:     Physical Exam Vitals reviewed.  Constitutional:      General: She is not in acute distress.  Appearance: She is not toxic-appearing.  Skin:    Coloration: Skin is not pale.  Neurological:     Mental Status: She is alert and oriented to person, place, and time.  Psychiatric:        Behavior: Behavior normal.      UC Treatments / Results  Labs (all labs ordered are listed, but only abnormal results are displayed) Labs Reviewed  CERVICOVAGINAL ANCILLARY ONLY    EKG   Radiology No results found.  Procedures Procedures (including critical care time)  Medications Ordered in UC Medications - No data to display  Initial Impression / Assessment and Plan / UC Course  I have reviewed the triage vital signs and the nursing notes.  Pertinent labs & imaging results that were available during my care of the patient were reviewed by me and considered in my medical decision making (see chart for details).     Vaginal self swab is done, and we will notify of any positives on that and treat per protocol.  She is amenable to having HIV and RPR lab drawn today also.  I had understood that the patient was amenable to having blood drawn for the above lab today, but then she walked out notifying the lab staff that she was going to come back another day for blood work.  Lab orders were discontinued; they would have fallen off the order list in the next 24 hours anyway. Final Clinical Impressions(s) / UC Diagnoses    Final diagnoses:  Possible exposure to STD     Discharge Instructions      Staff will notify you if there is anything positive on the swab or blood tests     ED Prescriptions   None    PDMP not reviewed this encounter.   Zenia Resides, MD 07/04/23 1623    Zenia Resides, MD 07/04/23 1623    Zenia Resides, MD 07/04/23 [phone]

## 2023-07-04 NOTE — ED Triage Notes (Signed)
Patient requesting STD testing. Patient states she had sex with a person and there was no protection. Patient denies any symptoms.

## 2023-07-08 ENCOUNTER — Telehealth: Payer: Self-pay | Admitting: Emergency Medicine

## 2023-07-08 MED ORDER — METRONIDAZOLE 500 MG PO TABS: 500.0000 mg | ORAL_TABLET | Freq: Two times a day (BID) | ORAL | 0 refills | Status: DC

## 2023-07-28 ENCOUNTER — Other Ambulatory Visit: Payer: Self-pay

## 2023-07-28 DIAGNOSIS — B379 Candidiasis, unspecified: Secondary | ICD-10-CM

## 2023-07-28 MED ORDER — FLUCONAZOLE 150 MG PO TABS: 150.0000 mg | ORAL_TABLET | Freq: Once | ORAL | 0 refills | Status: AC

## 2023-08-10 ENCOUNTER — Ambulatory Visit (HOSPITAL_COMMUNITY)
Admission: RE | Admit: 2023-08-10 | Discharge: 2023-08-10 | Disposition: A | Discharge status: Home / Self Care | Payer: Medicaid Other | Source: Ambulatory Visit | Attending: Emergency Medicine | Admitting: Emergency Medicine

## 2023-08-10 ENCOUNTER — Encounter (HOSPITAL_COMMUNITY): Payer: Self-pay

## 2023-08-10 ENCOUNTER — Other Ambulatory Visit: Payer: Self-pay

## 2023-08-10 VITALS — BP 118/69 | HR 79 | Temp 98.0°F | Resp 16 | Ht 67.0 in | Wt 158.7 lb

## 2023-08-10 DIAGNOSIS — Z113 Encounter for screening for infections with a predominantly sexual mode of transmission: Secondary | ICD-10-CM | POA: Diagnosis not present

## 2023-08-10 LAB — POCT URINE PREGNANCY: Preg Test, Ur: NEGATIVE

## 2023-08-10 LAB — HIV ANTIBODY (ROUTINE TESTING W REFLEX): HIV Screen 4th Generation wRfx: NONREACTIVE

## 2023-08-10 NOTE — ED Provider Notes (Signed)
[facility]    CSN: [id number] Arrival date & time: 08/10/23  1058      History   Chief Complaint Chief Complaint  Patient presents with   Vaginal Discharge    HPI Alicia Black is a 22 y.o. female.  Presents for STD testing Unknown exposure Reporting vaginal discharge and odor for a few days Denies abdominal pain, N/V, rash LMP 8/15  Past Medical History:  Diagnosis Date   Sickle cell trait [facility])     Patient Active Problem List   Diagnosis Date Noted   Preterm premature rupture of membranes (PPROM) delivered, current hospitalization 07/06/2020   Sickle cell trait ([facility]) 02/04/2020   Postpartum care following vaginal delivery 01/18/2020    Past Surgical History:  Procedure Laterality Date   NO PAST SURGERIES      OB History     Gravida  2   Para  1   Term      Preterm  1   AB  1   Living  1      SAB  1   IAB      Ectopic      Multiple  0   Live Births  1            Home Medications    Prior to Admission medications   Medication Sig Start Date End Date Taking? Authorizing Provider  metroNIDAZOLE (FLAGYL) 500 MG tablet Take 1 tablet (500 mg total) by mouth 2 (two) times daily. 07/08/23   Lamptey, Britta Mccreedy, MD    Family History Family History  Problem Relation Age of Onset   Asthma Father    Asthma Sister     Social History Social History   Tobacco Use   Smoking status: Former    Types: Cigars   Smokeless tobacco: Never  Vaping Use   Vaping status: Former  Substance Use Topics   Alcohol use: Not Currently   Drug use: Not Currently    Types: Marijuana     Allergies   Patient has no known allergies.   Review of Systems Review of Systems  Genitourinary:  Positive for vaginal discharge.   As per HPI  Physical Exam Triage Vital Signs ED Triage Vitals  Encounter Vitals Group     BP 08/10/23 1109 118/69     Systolic BP Percentile --      Diastolic BP Percentile --      Pulse Rate 08/10/23 1109 79      Resp 08/10/23 1109 16     Temp 08/10/23 1109 98 F (36.7 C)     Temp Source 08/10/23 1109 Oral     SpO2 08/10/23 1109 99 %     Weight 08/10/23 1111 158 lb 11.7 oz (72 kg)     Height 08/10/23 1111 5\' 7"  (1.702 m)     Head Circumference --      Peak Flow --      Pain Score 08/10/23 1109 0     Pain Loc --      Pain Education --      Exclude from Growth Chart --    No data found.  Updated Vital Signs BP 118/69 (BP Location: Right Arm)   Pulse 79   Temp 98 F (36.7 C) (Oral)   Resp 16   Ht 5\' 7"  (1.702 m)   Wt 158 lb 11.7 oz (72 kg)   SpO2 99%   BMI 24.86 kg/m   Visual Acuity Right Eye Distance:  Left Eye Distance:   Bilateral Distance:    Right Eye Near:   Left Eye Near:    Bilateral Near:     Physical Exam Vitals and nursing note reviewed.  Constitutional:      General: She is not in acute distress.    Appearance: Normal appearance.  Cardiovascular:     Rate and Rhythm: Normal rate and regular rhythm.     Heart sounds: Normal heart sounds.  Pulmonary:     Effort: Pulmonary effort is normal.     Breath sounds: Normal breath sounds.  Abdominal:     Tenderness: There is no abdominal tenderness.  Neurological:     Mental Status: She is alert and oriented to person, place, and time.     UC Treatments / Results  Labs (all labs ordered are listed, but only abnormal results are displayed) Labs Reviewed  HIV ANTIBODY (ROUTINE TESTING W REFLEX)  RPR  POCT URINE PREGNANCY  CERVICOVAGINAL ANCILLARY ONLY    EKG  Radiology No results found.  Procedures Procedures (including critical care time)  Medications Ordered in UC Medications - No data to display  Initial Impression / Assessment and Plan / UC Course  I have reviewed the triage vital signs and the nursing notes.  Pertinent labs & imaging results that were available during my care of the patient were reviewed by me and considered in my medical decision making (see chart for details).  UPT negative   Cytology swab and HIV/RPR pending Treat positive result as indicated Safe sex practices No questions at this time  Final Clinical Impressions(s) / UC Diagnoses   Final diagnoses:  Screen for STD (sexually transmitted disease)     Discharge Instructions      We will call you if anything on your swab or blood work returns positive. You can also see these results on MyChart. Please abstain from sexual intercourse until your results return.      ED Prescriptions   None    PDMP not reviewed this encounter.   Marlow Baars, [place] 08/10/23 1137

## 2023-08-10 NOTE — ED Triage Notes (Signed)
Pt requesting to be check for STD due to having vaginal discharge and bad odor.

## 2023-08-10 NOTE — Discharge Instructions (Addendum)
We will call you if anything on your swab or blood work returns positive. You can also see these results on MyChart. Please abstain from sexual intercourse until your results return.

## 2023-08-11 LAB — RPR: RPR Ser Ql: NONREACTIVE

## 2023-08-12 ENCOUNTER — Telehealth: Payer: Self-pay

## 2023-08-12 LAB — CERVICOVAGINAL ANCILLARY ONLY
Bacterial Vaginitis (gardnerella): POSITIVE — AB
Candida Glabrata: NEGATIVE
Candida Vaginitis: NEGATIVE
Chlamydia: NEGATIVE
Comment: NEGATIVE
Comment: NEGATIVE
Comment: NEGATIVE
Comment: NEGATIVE
Comment: NEGATIVE
Comment: NORMAL
Neisseria Gonorrhea: NEGATIVE
Trichomonas: NEGATIVE

## 2023-08-12 MED ORDER — METRONIDAZOLE 500 MG PO TABS
500.0000 mg | ORAL_TABLET | Freq: Two times a day (BID) | ORAL | 0 refills | Status: AC
Start: 2023-08-12 — End: 2023-08-19

## 2023-08-12 NOTE — Telephone Encounter (Signed)
 Per protocol, pt requires tx with metronidazole. Attempted to reach patient x1. LVM. Rx sent to pharmacy on file.

## 2023-08-26 DIAGNOSIS — Z23 Encounter for immunization: Secondary | ICD-10-CM | POA: Diagnosis not present

## 2023-10-13 ENCOUNTER — Ambulatory Visit (HOSPITAL_COMMUNITY): Payer: Self-pay

## 2023-10-15 ENCOUNTER — Ambulatory Visit (HOSPITAL_COMMUNITY)
Admission: EM | Admit: 2023-10-15 | Discharge: 2023-10-15 | Discharge status: Against Medical Advice | Payer: Medicaid Other

## 2023-10-15 NOTE — ED Notes (Signed)
No answer in lobby. This RN called patient and patient stated she left.

## 2023-10-29 ENCOUNTER — Ambulatory Visit (HOSPITAL_COMMUNITY)
Admission: RE | Admit: 2023-10-29 | Discharge: 2023-10-29 | Disposition: A | Discharge status: Home / Self Care | Payer: Medicaid Other | Source: Ambulatory Visit | Attending: Family Medicine | Admitting: Family Medicine

## 2023-10-29 ENCOUNTER — Encounter (HOSPITAL_COMMUNITY): Payer: Self-pay

## 2023-10-29 VITALS — BP 107/76 | HR 72 | Temp 98.1°F | Resp 16

## 2023-10-29 DIAGNOSIS — Z202 Contact with and (suspected) exposure to infections with a predominantly sexual mode of transmission: Secondary | ICD-10-CM | POA: Diagnosis not present

## 2023-10-29 NOTE — Discharge Instructions (Signed)
We have sent testing for sexually transmitted infections. We will notify you of any positive results once they are received. If required, we will prescribe any medications you might need.  Please refrain from all sexual activity for at least the next seven days.

## 2023-10-29 NOTE — ED Provider Notes (Signed)
  [facility]   [id number] 10/29/23 Arrival Time: 1300  ASSESSMENT & PLAN:  1. Possible exposure to STD       Discharge Instructions      We have sent testing for sexually transmitted infections. We will notify you of any positive results once they are received. If required, we will prescribe any medications you might need.  Please refrain from all sexual activity for at least the next seven days.     Without s/s of PID.  Labs Reviewed  CERVICOVAGINAL ANCILLARY ONLY    Pending: Unresulted Labs (From admission, onward)    None        Will notify of any positive results. Instructed to refrain from sexual activity for at least seven days.  Reviewed expectations re: course of current medical issues. Questions answered. Outlined signs and symptoms indicating need for more acute intervention. Patient verbalized understanding. After Visit Summary given.   SUBJECTIVE:  Alicia Black is a 22 y.o. female who request STI testing. Possible exposure. No symptoms.   OBJECTIVE:  Vitals:   10/29/23 1332  BP: 107/76  Pulse: 72  Resp: 16  Temp: 98.1 F (36.7 C)  TempSrc: Oral  SpO2: 98%     General appearance: alert, cooperative, appears stated age and no distress GU: deferred Skin: warm and dry Psychological: alert and cooperative; normal mood and affect.    Labs Reviewed  CERVICOVAGINAL ANCILLARY ONLY    No Known Allergies  Past Medical History:  Diagnosis Date   Sickle cell trait (HCC)    Family History  Problem Relation Age of Onset   Asthma Father    Asthma Sister    Social History   Socioeconomic History   Marital status: Single    Spouse name: Not on file   Number of children: Not on file   Years of education: Not on file   Highest education level: Not on file  Occupational History   Occupation: housekeeping  Tobacco Use   Smoking status: Former    Types: Cigars   Smokeless tobacco: Never  Vaping Use   Vaping status:  Former  Substance and Sexual Activity   Alcohol use: Not Currently   Drug use: Not Currently    Types: Marijuana   Sexual activity: Not Currently  Other Topics Concern   Not on file  Social History Narrative   Not on file   Social Determinants of Health   Financial Resource Strain: Not on file  Food Insecurity: Not on file  Transportation Needs: Not on file  Physical Activity: Not on file  Stress: Not on file  Social Connections: Not on file  Intimate Partner Violence: Not on file           [place], MD 10/29/23 1349

## 2023-10-31 LAB — CERVICOVAGINAL ANCILLARY ONLY
Chlamydia: NEGATIVE
Comment: NEGATIVE
Comment: NEGATIVE
Comment: NORMAL
Neisseria Gonorrhea: NEGATIVE
Trichomonas: NEGATIVE

## 2024-01-06 ENCOUNTER — Ambulatory Visit: Payer: Medicaid Other

## 2024-01-11 ENCOUNTER — Ambulatory Visit (HOSPITAL_COMMUNITY): Payer: Self-pay

## 2024-01-13 ENCOUNTER — Other Ambulatory Visit: Payer: Self-pay | Admitting: Obstetrics and Gynecology

## 2024-01-15 ENCOUNTER — Other Ambulatory Visit (HOSPITAL_COMMUNITY)
Admission: RE | Admit: 2024-01-15 | Discharge: 2024-01-15 | Disposition: A | Discharge status: Home / Self Care | Payer: Medicaid Other | Source: Ambulatory Visit | Attending: Obstetrics and Gynecology | Admitting: Obstetrics and Gynecology

## 2024-01-15 ENCOUNTER — Ambulatory Visit (INDEPENDENT_AMBULATORY_CARE_PROVIDER_SITE_OTHER): Payer: Medicaid Other | Admitting: General Practice

## 2024-01-15 VITALS — BP 130/83 | HR 64 | Ht 67.0 in | Wt 151.0 lb

## 2024-01-15 DIAGNOSIS — Z113 Encounter for screening for infections with a predominantly sexual mode of transmission: Secondary | ICD-10-CM

## 2024-01-15 NOTE — Progress Notes (Signed)
Patient was assessed and managed by nursing staff during this encounter. I have reviewed the chart and agree with the documentation and plan. I have also made any necessary editorial changes.  Catalina Antigua, MD 01/15/2024 1:08 PM

## 2024-01-15 NOTE — Progress Notes (Signed)
SUBJECTIVE:  23 y.o. female presents for STD testing. Denies abnormal vaginal bleeding or significant pelvic pain or fever. No UTI symptoms.Unsure of exposure to STD.  No LMP recorded.  OBJECTIVE:  She appears well, afebrile. Urine dipstick: not done.  ASSESSMENT:  Vaginal Discharge  Vaginal Odor   PLAN:  GC, chlamydia, trichomonas, BVAG, CVAG probe sent to lab. Treatment: To be determined once lab results are received ROV prn if symptoms persist or worsen.

## 2024-01-16 LAB — CERVICOVAGINAL ANCILLARY ONLY
Bacterial Vaginitis (gardnerella): POSITIVE — AB
Candida Glabrata: NEGATIVE
Candida Vaginitis: NEGATIVE
Chlamydia: NEGATIVE
Comment: NEGATIVE
Comment: NEGATIVE
Comment: NEGATIVE
Comment: NEGATIVE
Comment: NEGATIVE
Comment: NORMAL
Neisseria Gonorrhea: NEGATIVE
Trichomonas: NEGATIVE

## 2024-01-19 ENCOUNTER — Encounter: Payer: Self-pay | Admitting: Obstetrics and Gynecology

## 2024-01-19 MED ORDER — METRONIDAZOLE 500 MG PO TABS: 500.0000 mg | ORAL_TABLET | Freq: Two times a day (BID) | ORAL | 0 refills | Status: DC

## 2024-01-19 NOTE — Addendum Note (Signed)
Addended by: Catalina Antigua on: 01/19/2024 08:42 AM   Modules accepted: Orders

## 2024-02-19 ENCOUNTER — Ambulatory Visit

## 2024-02-19 ENCOUNTER — Other Ambulatory Visit (HOSPITAL_COMMUNITY)
Admission: RE | Admit: 2024-02-19 | Discharge: 2024-02-19 | Disposition: A | Discharge status: Home / Self Care | Source: Ambulatory Visit | Attending: Obstetrics and Gynecology | Admitting: Obstetrics and Gynecology

## 2024-02-19 VITALS — BP 124/84 | HR 70

## 2024-02-19 DIAGNOSIS — Z113 Encounter for screening for infections with a predominantly sexual mode of transmission: Secondary | ICD-10-CM | POA: Diagnosis not present

## 2024-02-19 MED ORDER — FLUCONAZOLE 150 MG PO TABS: 150.0000 mg | ORAL_TABLET | Freq: Once | ORAL | 0 refills | Status: AC

## 2024-02-19 NOTE — Progress Notes (Signed)
 SUBJECTIVE:  23 y.o. female complains of white vaginal discharge for 2 day(s). Feels like she has a yeast infection. Denies abnormal vaginal bleeding or significant pelvic pain or fever. No UTI symptoms. Denies history of known exposure to STD.  OBJECTIVE:  She appears well, afebrile. Urine dipstick: not done.  ASSESSMENT:  Vaginal Discharge  Vaginal Odor   PLAN:  GC, chlamydia, trichomonas, BVAG, CVAG probe sent to lab. Treatment: To be determined once lab results are received ROV prn if symptoms persist or worsen.

## 2024-02-20 LAB — CERVICOVAGINAL ANCILLARY ONLY
Bacterial Vaginitis (gardnerella): POSITIVE — AB
Candida Glabrata: NEGATIVE
Candida Vaginitis: POSITIVE — AB
Chlamydia: NEGATIVE
Comment: NEGATIVE
Comment: NEGATIVE
Comment: NEGATIVE
Comment: NEGATIVE
Comment: NEGATIVE
Comment: NORMAL
Neisseria Gonorrhea: NEGATIVE
Trichomonas: NEGATIVE

## 2024-02-24 ENCOUNTER — Encounter: Payer: Self-pay | Admitting: Obstetrics and Gynecology

## 2024-02-24 MED ORDER — METRONIDAZOLE 500 MG PO TABS: 500.0000 mg | ORAL_TABLET | Freq: Two times a day (BID) | ORAL | 0 refills | Status: AC

## 2024-02-24 MED ORDER — FLUCONAZOLE 150 MG PO TABS: 150.0000 mg | ORAL_TABLET | Freq: Once | ORAL | 0 refills | Status: DC

## 2024-02-24 NOTE — Addendum Note (Signed)
 Addended by: Harvie Bridge on: 02/24/2024 06:19 PM   Modules accepted: Orders

## 2024-03-02 ENCOUNTER — Ambulatory Visit: Admitting: Obstetrics and Gynecology

## 2024-03-22 ENCOUNTER — Ambulatory Visit: Admitting: Obstetrics

## 2024-04-27 ENCOUNTER — Other Ambulatory Visit: Payer: Self-pay

## 2024-04-27 ENCOUNTER — Ambulatory Visit
Admission: RE | Admit: 2024-04-27 | Discharge: 2024-04-27 | Disposition: A | Discharge status: Home / Self Care | Payer: Self-pay | Source: Ambulatory Visit

## 2024-04-27 VITALS — BP 113/72 | HR 72 | Temp 98.2°F | Resp 18 | Ht 67.0 in | Wt 145.0 lb

## 2024-04-27 DIAGNOSIS — Z3A01 Less than 8 weeks gestation of pregnancy: Secondary | ICD-10-CM

## 2024-04-27 NOTE — ED Triage Notes (Signed)
 Pt presents to urgent care for blood testing to see how far along she is in pregnancy. Pt states about four days ago, she took six pregnancy tests and all were positive. Pt's wishes to abort this pregnancy. Currently denies pain at this time. Pt does voice she had one occurrence of spotting yesterday when using the bathroom.

## 2024-04-27 NOTE — Discharge Instructions (Signed)
 You were seen today for concerns of pregnancy Given the fact that your last menstrual period was at the end of April I suspect that you are likely about 4 to [redacted] weeks pregnant.  You have expressed desire to have the pregnancy terminated.  In [place]  you have up until 12 weeks of pregnancy to get an abortion.  You have stated that you also have an appointment with the provider to schedule this in early June.  Please keep this appointment so that you can get appropriate treatment per your wishes. If at any point you start to have cramping, abdominal pain, vaginal bleeding prior to your appointment please go to the emergency room as these could be signs of a medical emergency.

## 2024-04-27 NOTE — ED Provider Notes (Signed)
 [facility]    CSN: [id number] Arrival date & time: 04/27/24  1004      History   Chief Complaint Chief Complaint  Patient presents with   Possible Pregnancy    Entered by patient    HPI Alicia Black is a 23 y.o. female.   HPI  Patient is here with concerns for pregnancy  She states her last period was last month (April 26th) and she is usually very regular  She has taken several pregnancy tests at home which were positive  She would like to have blood test for more definitive timing of pregnancy She reports she did have some light pink on paper with wiping yesterday  She denies abdominal pain or cramping, gross vaginal bleeding She has an apt with [employer]    Past Medical History:  Diagnosis Date   Sickle cell trait [facility])     Patient Active Problem List   Diagnosis Date Noted   Preterm premature rupture of membranes (PPROM) delivered, current hospitalization 07/06/2020   Sickle cell trait (HCC) 02/04/2020   Postpartum care following vaginal delivery 01/18/2020    Past Surgical History:  Procedure Laterality Date   NO PAST SURGERIES      OB History     Gravida  3   Para  1   Term      Preterm  1   AB  1   Living  1      SAB  1   IAB      Ectopic      Multiple  0   Live Births  1            Home Medications    Prior to Admission medications   Not on File    Family History Family History  Problem Relation Age of Onset   Asthma Father    Asthma Sister     Social History Social History   Tobacco Use   Smoking status: Former    Types: Cigars   Smokeless tobacco: Never  Vaping Use   Vaping status: Former  Substance Use Topics   Alcohol use: Not Currently   Drug use: Not Currently    Types: Marijuana     Allergies   Patient has no known allergies.   Review of Systems Review of Systems   Physical Exam Triage Vital Signs ED Triage Vitals  Encounter Vitals Group     BP 04/27/24 1026  113/72     Systolic BP Percentile --      Diastolic BP Percentile --      Pulse Rate 04/27/24 1026 72     Resp 04/27/24 1026 18     Temp 04/27/24 1026 98.2 F (36.8 C)     Temp Source 04/27/24 1026 Oral     SpO2 04/27/24 1026 99 %     Weight 04/27/24 1025 145 lb (65.8 kg)     Height 04/27/24 1025 5\' 7"  (1.702 m)     Head Circumference --      Peak Flow --      Pain Score 04/27/24 1025 0     Pain Loc --      Pain Education --      Exclude from Growth Chart --    No data found.  Updated Vital Signs BP 113/72 (BP Location: Left Arm)   Pulse 72   Temp 98.2 F (36.8 C) (Oral)   Resp 18   Ht 5\' 7"  (1.702 m)  Wt 145 lb (65.8 kg)   LMP 03/27/2024 (Exact Date)   SpO2 99%   BMI 22.71 kg/m   Visual Acuity Right Eye Distance:   Left Eye Distance:   Bilateral Distance:    Right Eye Near:   Left Eye Near:    Bilateral Near:     Physical Exam Vitals reviewed.  Constitutional:      General: She is awake.     Appearance: Normal appearance. She is well-developed and well-groomed.  HENT:     Head: Normocephalic and atraumatic.  Eyes:     General: Lids are normal. Gaze aligned appropriately.     Extraocular Movements: Extraocular movements intact.     Conjunctiva/sclera: Conjunctivae normal.  Pulmonary:     Effort: Pulmonary effort is normal.  Neurological:     General: No focal deficit present.     Mental Status: She is alert and oriented to person, place, and time.     GCS: GCS eye subscore is 4. GCS verbal subscore is 5. GCS motor subscore is 6.     Cranial Nerves: No cranial nerve deficit, dysarthria or facial asymmetry.  Psychiatric:        Attention and Perception: Attention and perception normal.        Mood and Affect: Mood and affect normal.        Speech: Speech normal.        Behavior: Behavior normal. Behavior is cooperative.        Thought Content: Thought content normal.        Judgment: Judgment normal.      UC Treatments / Results  Labs (all labs  ordered are listed, but only abnormal results are displayed) Labs Reviewed - No data to display  EKG   Radiology No results found.  Procedures Procedures (including critical care time)  Medications Ordered in UC Medications - No data to display  Initial Impression / Assessment and Plan / UC Course  I have reviewed the triage vital signs and the nursing notes.  Pertinent labs & imaging results that were available during my care of the patient were reviewed by me and considered in my medical decision making (see chart for details).      Final Clinical Impressions(s) / UC Diagnoses   Final diagnoses:  Less than [redacted] weeks gestation of pregnancy   Patient presents today with concerns for pregnancy.  She reports that she has taken several home urine pregnancy test and they have all returned positive.  She reports that her LMP was 03/27/2024 and she is very regular.  Patient initially desired quantitative hCG testing today as she would like more definitive estimate of how far along she is in pregnancy.  I reviewed with patient that we can do this today but we typically use the last known menstrual as well as ultrasound imaging to provide a more definitive estimation of gestation.  With this information patient declined hCG testing today.  She does have plans to seek abortive care and has an appointment scheduled for the beginning of June.  Recommend she keep this appointment for further management.  ED and return precautions reviewed and provided in after visit summary.  Follow-up as needed    Discharge Instructions      You were seen today for concerns of pregnancy Given the fact that your last menstrual period was at the end of April I suspect that you are likely about 4 to [redacted] weeks pregnant.  You have expressed desire to have the  pregnancy terminated.  In [place]  you have up until 12 weeks of pregnancy to get an abortion.  You have stated that you also have an appointment with the  provider to schedule this in early June.  Please keep this appointment so that you can get appropriate treatment per your wishes. If at any point you start to have cramping, abdominal pain, vaginal bleeding prior to your appointment please go to the emergency room as these could be signs of a medical emergency.    ED Prescriptions   None    PDMP not reviewed this encounter.   Jerona Mooring, PA-C 04/27/24 1746

## 2024-05-24 ENCOUNTER — Other Ambulatory Visit: Payer: Self-pay

## 2024-05-24 ENCOUNTER — Other Ambulatory Visit: Payer: Self-pay | Admitting: Obstetrics and Gynecology

## 2024-05-24 MED ORDER — METRONIDAZOLE 500 MG PO TABS: 500.0000 mg | ORAL_TABLET | Freq: Two times a day (BID) | ORAL | 0 refills | Status: AC

## 2024-05-25 ENCOUNTER — Other Ambulatory Visit: Payer: Self-pay | Admitting: Obstetrics and Gynecology

## 2024-06-17 ENCOUNTER — Ambulatory Visit: Payer: Self-pay

## 2024-06-23 ENCOUNTER — Ambulatory Visit (HOSPITAL_COMMUNITY)
Admission: RE | Admit: 2024-06-23 | Discharge: 2024-06-23 | Disposition: A | Discharge status: Home / Self Care | Payer: Self-pay | Source: Ambulatory Visit | Attending: Emergency Medicine | Admitting: Emergency Medicine

## 2024-06-23 ENCOUNTER — Encounter (HOSPITAL_COMMUNITY): Payer: Self-pay

## 2024-06-23 VITALS — BP 117/79 | HR 100 | Temp 98.2°F | Resp 16

## 2024-06-23 DIAGNOSIS — Z113 Encounter for screening for infections with a predominantly sexual mode of transmission: Secondary | ICD-10-CM | POA: Insufficient documentation

## 2024-06-23 DIAGNOSIS — N898 Other specified noninflammatory disorders of vagina: Secondary | ICD-10-CM | POA: Diagnosis present

## 2024-06-23 DIAGNOSIS — Z3202 Encounter for pregnancy test, result negative: Secondary | ICD-10-CM

## 2024-06-23 LAB — POCT URINE PREGNANCY: Preg Test, Ur: NEGATIVE

## 2024-06-23 NOTE — ED Provider Notes (Signed)
 [facility]    CSN: [id number] Arrival date & time: 06/23/24  1020      History   Chief Complaint Chief Complaint  Patient presents with   STI Check    HPI Alicia Black is a 23 y.o. female.   Patient presents with vaginal discharge x 2 days.  Patient states that she took an old prescription for bacterial vaginosis, but continues to have vaginal discharge.  Denies vaginal pain, continued vaginal bleeding, vaginal lesions, dysuria, hematuria, urinary frequency/urgency, abdominal pain, flank pain, and fever.  Patient does report recent unprotected sexual intercourse.  Denies any known exposures to STDs.  Patient unsure of LMP due to having an abortion in May and then having bleeding for approximately 1 month after this.  The history is provided by the patient and medical records.    Past Medical History:  Diagnosis Date   Sickle cell trait [facility])     Patient Active Problem List   Diagnosis Date Noted   Preterm premature rupture of membranes (PPROM) delivered, current hospitalization 07/06/2020   Sickle cell trait (HCC) 02/04/2020   Postpartum care following vaginal delivery 01/18/2020    Past Surgical History:  Procedure Laterality Date   elective abortion     June 2025   NO PAST SURGERIES      OB History     Gravida  3   Para  1   Term      Preterm  1   AB  1   Living  1      SAB  1   IAB      Ectopic      Multiple  0   Live Births  1            Home Medications    Prior to Admission medications   Not on File    Family History Family History  Problem Relation Age of Onset   Asthma Father    Asthma Sister     Social History Social History   Tobacco Use   Smoking status: Former    Types: Cigars   Smokeless tobacco: Never  Vaping Use   Vaping status: Every Day  Substance Use Topics   Alcohol use: Not Currently   Drug use: Not Currently    Types: Marijuana     Allergies   Patient has no known  allergies.   Review of Systems Review of Systems  Per HPI  Physical Exam Triage Vital Signs ED Triage Vitals  Encounter Vitals Group     BP 06/23/24 1050 117/79     Girls Systolic BP Percentile --      Girls Diastolic BP Percentile --      Boys Systolic BP Percentile --      Boys Diastolic BP Percentile --      Pulse Rate 06/23/24 1050 100     Resp 06/23/24 1050 16     Temp 06/23/24 1050 98.2 F (36.8 C)     Temp Source 06/23/24 1050 Oral     SpO2 06/23/24 1050 97 %     Weight --      Height --      Head Circumference --      Peak Flow --      Pain Score 06/23/24 1051 0     Pain Loc --      Pain Education --      Exclude from Growth Chart --    No data found.  Updated Vital  Signs BP 117/79   Pulse 100   Temp 98.2 F (36.8 C) (Oral)   Resp 16   LMP  (LMP Unknown) Comment: Had abortion 05/17/24, followed by approx 1 month of vag bleeding  SpO2 97%   Breastfeeding No   Visual Acuity Right Eye Distance:   Left Eye Distance:   Bilateral Distance:    Right Eye Near:   Left Eye Near:    Bilateral Near:     Physical Exam Vitals and nursing note reviewed.  Constitutional:      General: She is awake. She is not in acute distress.    Appearance: Normal appearance. She is well-developed and well-groomed. She is not ill-appearing.  Genitourinary:    Comments: Exam deferred Neurological:     Mental Status: She is alert.  Psychiatric:        Behavior: Behavior is cooperative.      UC Treatments / Results  Labs (all labs ordered are listed, but only abnormal results are displayed) Labs Reviewed  POCT URINE PREGNANCY  CERVICOVAGINAL ANCILLARY ONLY    EKG   Radiology No results found.  Procedures Procedures (including critical care time)  Medications Ordered in UC Medications - No data to display  Initial Impression / Assessment and Plan / UC Course  I have reviewed the triage vital signs and the nursing notes.  Pertinent labs & imaging results  that were available during my care of the patient were reviewed by me and considered in my medical decision making (see chart for details).     Patient is overall well-appearing.  Vitals are stable.  GU exam deferred.  Patient perform self swab for STD/STI.  Urine pregnancy negative.  HIV and RPR declined.  Discussed follow-up and return precautions. Final Clinical Impressions(s) / UC Diagnoses   Final diagnoses:  Vaginal discharge  Screening for STD (sexually transmitted disease)     Discharge Instructions      Your pregnancy test is negative today.  Your results will come back over the next few days and someone will call if results are positive and require treatment.  Return here as needed.    ED Prescriptions   None    PDMP not reviewed this encounter.   Johnie Flaming A, NP 06/23/24 1131

## 2024-06-23 NOTE — Discharge Instructions (Signed)
 Your pregnancy test is negative today.  Your results will come back over the next few days and someone will call if results are positive and require treatment.  Return here as needed.

## 2024-06-23 NOTE — ED Triage Notes (Signed)
 Pt reports having pregnancy and abortion in May, and had unprotected sex since then; also felt she may have had BV since then since she was having vaginal bleeding x approx 1 month, so she took an old Rx, but never finished them. Denies any vaginal discharge or any other sxs at this time, but wishes to be checked for STIs and BV.

## 2024-06-24 ENCOUNTER — Encounter (HOSPITAL_COMMUNITY): Payer: Self-pay

## 2024-06-24 ENCOUNTER — Ambulatory Visit (HOSPITAL_COMMUNITY): Payer: Self-pay

## 2024-06-24 LAB — CERVICOVAGINAL ANCILLARY ONLY
Bacterial Vaginitis (gardnerella): POSITIVE — AB
Candida Glabrata: NEGATIVE
Candida Vaginitis: POSITIVE — AB
Chlamydia: NEGATIVE
Comment: NEGATIVE
Comment: NEGATIVE
Comment: NEGATIVE
Comment: NEGATIVE
Comment: NEGATIVE
Comment: NORMAL
Neisseria Gonorrhea: NEGATIVE
Trichomonas: NEGATIVE

## 2024-06-24 MED ORDER — METRONIDAZOLE 0.75 % VA GEL: 1.0000 | Freq: Every day | VAGINAL | 0 refills | Status: AC

## 2024-06-24 MED ORDER — METRONIDAZOLE 500 MG PO TABS
500.0000 mg | ORAL_TABLET | Freq: Two times a day (BID) | ORAL | 0 refills | Status: AC
Start: 2024-06-24 — End: 2024-07-01

## 2024-06-24 MED ORDER — FLUCONAZOLE 150 MG PO TABS: 150.0000 mg | ORAL_TABLET | Freq: Once | ORAL | 0 refills | Status: AC

## 2024-06-25 MED ORDER — FLUCONAZOLE 150 MG PO TABS: 150.0000 mg | ORAL_TABLET | Freq: Once | ORAL | 0 refills | Status: AC

## 2024-06-25 NOTE — Addendum Note (Signed)
 Addended by: ARNALDO ALFONSO RAMAN on: 06/25/2024 11:14 AM   Modules accepted: Orders

## 2024-06-25 NOTE — Telephone Encounter (Signed)
 Pt request diflucan  for abx-associated yeast. Sent per protocol.

## 2024-07-28 DIAGNOSIS — F4323 Adjustment disorder with mixed anxiety and depressed mood: Secondary | ICD-10-CM | POA: Diagnosis not present

## 2024-08-04 DIAGNOSIS — F4323 Adjustment disorder with mixed anxiety and depressed mood: Secondary | ICD-10-CM | POA: Diagnosis not present

## 2024-09-22 ENCOUNTER — Ambulatory Visit

## 2024-09-22 ENCOUNTER — Other Ambulatory Visit (HOSPITAL_COMMUNITY)
Admission: RE | Admit: 2024-09-22 | Discharge: 2024-09-22 | Disposition: A | Discharge status: Home / Self Care | Source: Ambulatory Visit | Attending: Obstetrics & Gynecology | Admitting: Obstetrics & Gynecology

## 2024-09-22 VITALS — BP 116/72 | HR 73

## 2024-09-22 DIAGNOSIS — N898 Other specified noninflammatory disorders of vagina: Secondary | ICD-10-CM | POA: Insufficient documentation

## 2024-09-22 NOTE — Progress Notes (Signed)
..  SUBJECTIVE:  23 y.o. female complains of white vaginal discharge for 2 day(s). Denies abnormal vaginal bleeding or significant pelvic pain or fever. No UTI symptoms. Denies history of known exposure to STD.  LMP 08/25/24  OBJECTIVE:  She appears well, afebrile. Urine dipstick: not done.  ASSESSMENT:  Vaginal Discharge    PLAN:  GC, chlamydia, trichomonas, BVAG, CVAG probe sent to lab. Treatment: To be determined once lab results are received ROV prn if symptoms persist or worsen.

## 2024-09-23 ENCOUNTER — Ambulatory Visit: Payer: Self-pay | Admitting: Obstetrics & Gynecology

## 2024-09-23 DIAGNOSIS — B379 Candidiasis, unspecified: Secondary | ICD-10-CM

## 2024-09-23 DIAGNOSIS — N898 Other specified noninflammatory disorders of vagina: Secondary | ICD-10-CM

## 2024-09-23 DIAGNOSIS — A749 Chlamydial infection, unspecified: Secondary | ICD-10-CM

## 2024-09-23 LAB — CERVICOVAGINAL ANCILLARY ONLY
Bacterial Vaginitis (gardnerella): POSITIVE — AB
Candida Glabrata: NEGATIVE
Candida Vaginitis: POSITIVE — AB
Chlamydia: POSITIVE — AB
Comment: NEGATIVE
Comment: NEGATIVE
Comment: NEGATIVE
Comment: NEGATIVE
Comment: NEGATIVE
Comment: NORMAL
Neisseria Gonorrhea: NEGATIVE
Trichomonas: NEGATIVE

## 2024-09-23 MED ORDER — METRONIDAZOLE 500 MG PO TABS: 500.0000 mg | ORAL_TABLET | Freq: Two times a day (BID) | ORAL | 0 refills | Status: AC

## 2024-09-23 MED ORDER — FLUCONAZOLE 150 MG PO TABS: 150.0000 mg | ORAL_TABLET | Freq: Once | ORAL | 0 refills | Status: AC

## 2024-09-23 MED ORDER — AZITHROMYCIN 250 MG PO TABS: 1000.0000 mg | ORAL_TABLET | Freq: Once | ORAL | 0 refills | Status: AC

## 2024-09-28 ENCOUNTER — Other Ambulatory Visit: Payer: Self-pay

## 2024-09-28 MED ORDER — AZITHROMYCIN 250 MG PO TABS: 1000.0000 mg | ORAL_TABLET | Freq: Once | ORAL | 0 refills | Status: AC

## 2024-09-29 DIAGNOSIS — F321 Major depressive disorder, single episode, moderate: Secondary | ICD-10-CM | POA: Diagnosis not present

## 2024-10-13 ENCOUNTER — Ambulatory Visit

## 2024-11-11 ENCOUNTER — Ambulatory Visit

## 2024-11-11 ENCOUNTER — Other Ambulatory Visit (HOSPITAL_COMMUNITY)
Admission: RE | Admit: 2024-11-11 | Discharge: 2024-11-11 | Disposition: A | Discharge status: Home / Self Care | Source: Ambulatory Visit | Attending: Obstetrics & Gynecology | Admitting: Obstetrics & Gynecology

## 2024-11-11 VITALS — BP 128/78 | HR 77 | Wt 146.0 lb

## 2024-11-11 DIAGNOSIS — N898 Other specified noninflammatory disorders of vagina: Secondary | ICD-10-CM | POA: Diagnosis present

## 2024-11-11 DIAGNOSIS — Z8619 Personal history of other infectious and parasitic diseases: Secondary | ICD-10-CM | POA: Diagnosis present

## 2024-11-11 NOTE — Progress Notes (Signed)
..  SUBJECTIVE:  23 y.o. female is in the office for test of cure for previous CT infection. She states that she completed the medications and complains of  vaginal discharge for a few day(s). Denies abnormal vaginal bleeding or significant pelvic pain or fever. No UTI symptoms. Denies history of known exposure to STD.  No LMP recorded.  OBJECTIVE:  She appears well, afebrile. Urine dipstick: not done.  ASSESSMENT:  TOC Vaginal Discharge     PLAN:  GC, chlamydia, trichomonas, BVAG, CVAG probe sent to lab. Treatment: To be determined once lab results are received ROV prn if symptoms persist or worsen.

## 2024-11-15 LAB — CERVICOVAGINAL ANCILLARY ONLY
Bacterial Vaginitis (gardnerella): POSITIVE — AB
Candida Glabrata: NEGATIVE
Candida Vaginitis: NEGATIVE
Chlamydia: NEGATIVE
Comment: NEGATIVE
Comment: NEGATIVE
Comment: NEGATIVE
Comment: NEGATIVE
Comment: NEGATIVE
Comment: NORMAL
Neisseria Gonorrhea: NEGATIVE
Trichomonas: NEGATIVE
